# Patient Record
Sex: Male | Born: 1980 | Race: Black or African American | Hispanic: No | Marital: Single | Smoking: Never smoker
Health system: Southern US, Community
[De-identification: ages and names within clinical notes are randomized; demographics above are authoritative.]

## PROBLEM LIST (undated history)

## (undated) DIAGNOSIS — I2699 Other pulmonary embolism without acute cor pulmonale: Secondary | ICD-10-CM

## (undated) DIAGNOSIS — I82409 Acute embolism and thrombosis of unspecified deep veins of unspecified lower extremity: Secondary | ICD-10-CM

## (undated) HISTORY — DX: Other pulmonary embolism without acute cor pulmonale: I26.99

## (undated) HISTORY — PX: NO PAST SURGERIES: SHX2092

## (undated) HISTORY — DX: Acute embolism and thrombosis of unspecified deep veins of unspecified lower extremity: I82.409

---

## 2002-01-29 ENCOUNTER — Emergency Department (HOSPITAL_COMMUNITY): Admission: EM | Admit: 2002-01-29 | Discharge: 2002-01-29 | Payer: Self-pay | Admitting: Emergency Medicine

## 2003-07-25 ENCOUNTER — Emergency Department (HOSPITAL_COMMUNITY): Admission: EM | Admit: 2003-07-25 | Discharge: 2003-07-25 | Payer: Self-pay | Admitting: Emergency Medicine

## 2007-11-24 ENCOUNTER — Emergency Department (HOSPITAL_COMMUNITY): Admission: EM | Admit: 2007-11-24 | Discharge: 2007-11-24 | Payer: Self-pay | Admitting: Family Medicine

## 2010-05-16 ENCOUNTER — Emergency Department (HOSPITAL_COMMUNITY)
Admission: EM | Admit: 2010-05-16 | Discharge: 2010-05-16 | Disposition: A | Discharge status: Home / Self Care | Payer: Self-pay | Attending: Emergency Medicine | Admitting: Emergency Medicine

## 2010-05-16 ENCOUNTER — Emergency Department (HOSPITAL_COMMUNITY): Payer: Self-pay

## 2010-05-16 DIAGNOSIS — I10 Essential (primary) hypertension: Secondary | ICD-10-CM | POA: Insufficient documentation

## 2010-05-16 DIAGNOSIS — M25519 Pain in unspecified shoulder: Secondary | ICD-10-CM | POA: Insufficient documentation

## 2010-05-16 LAB — POCT I-STAT, CHEM 8
BUN: 6 mg/dL (ref 6–23)
Creatinine, Ser: 1.5 mg/dL (ref 0.4–1.5)
Glucose, Bld: 96 mg/dL (ref 70–99)
Potassium: 4.5 mEq/L (ref 3.5–5.1)
Sodium: 140 mEq/L (ref 135–145)
TCO2: 28 mmol/L (ref 0–100)

## 2010-05-16 LAB — POCT CARDIAC MARKERS
CKMB, poc: <1 ng/mL — ABNORMAL LOW (ref 1.0–8.0)
Myoglobin, poc: 58.3 ng/mL (ref 12–200)

## 2015-08-04 ENCOUNTER — Emergency Department (HOSPITAL_COMMUNITY): Payer: No Typology Code available for payment source

## 2015-08-04 ENCOUNTER — Emergency Department (HOSPITAL_COMMUNITY)
Admission: EM | Admit: 2015-08-04 | Discharge: 2015-08-04 | Disposition: A | Discharge status: Home / Self Care | Payer: No Typology Code available for payment source | Attending: Emergency Medicine | Admitting: Emergency Medicine

## 2015-08-04 ENCOUNTER — Encounter (HOSPITAL_COMMUNITY): Payer: Self-pay | Admitting: Emergency Medicine

## 2015-08-04 DIAGNOSIS — M542 Cervicalgia: Secondary | ICD-10-CM | POA: Diagnosis present

## 2015-08-04 DIAGNOSIS — Y9389 Activity, other specified: Secondary | ICD-10-CM | POA: Insufficient documentation

## 2015-08-04 DIAGNOSIS — Y999 Unspecified external cause status: Secondary | ICD-10-CM | POA: Diagnosis not present

## 2015-08-04 DIAGNOSIS — Y92410 Unspecified street and highway as the place of occurrence of the external cause: Secondary | ICD-10-CM | POA: Insufficient documentation

## 2015-08-04 MED ORDER — IBUPROFEN 800 MG PO TABS: 800.0000 mg | ORAL_TABLET | Freq: Three times a day (TID) | ORAL | 0 refills | Status: DC | PRN

## 2015-08-04 MED ORDER — CYCLOBENZAPRINE HCL 10 MG PO TABS: 10.0000 mg | ORAL_TABLET | Freq: Three times a day (TID) | ORAL | 0 refills | Status: DC | PRN
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
## 2015-08-04 NOTE — Discharge Instructions (Signed)
Read the information below.  Use the prescribed medication as directed.  Please discuss all new medications with your pharmacist.  You may return to the Emergency Department at any time for worsening condition or any new symptoms that concern you.    If you develop uncontrolled pain, loss of control of bowel or bladder, weakness or numbness in your arms or legs, or are unable to walk, return to the ER for a recheck.

## 2017-07-17 ENCOUNTER — Emergency Department (HOSPITAL_COMMUNITY): Payer: BC Managed Care – PPO

## 2017-07-17 ENCOUNTER — Encounter (HOSPITAL_COMMUNITY): Payer: Self-pay | Admitting: *Deleted

## 2017-07-17 ENCOUNTER — Inpatient Hospital Stay (HOSPITAL_COMMUNITY)
Admission: EM | Admit: 2017-07-17 | Discharge: 2017-07-20 | DRG: 299 | Disposition: A | Discharge status: Home / Self Care | Payer: BC Managed Care – PPO | Attending: Internal Medicine | Admitting: Internal Medicine

## 2017-07-17 ENCOUNTER — Other Ambulatory Visit: Payer: Self-pay

## 2017-07-17 DIAGNOSIS — I82422 Acute embolism and thrombosis of left iliac vein: Principal | ICD-10-CM | POA: Diagnosis present

## 2017-07-17 DIAGNOSIS — I2699 Other pulmonary embolism without acute cor pulmonale: Secondary | ICD-10-CM

## 2017-07-17 DIAGNOSIS — Z832 Family history of diseases of the blood and blood-forming organs and certain disorders involving the immune mechanism: Secondary | ICD-10-CM

## 2017-07-17 DIAGNOSIS — I824Y1 Acute embolism and thrombosis of unspecified deep veins of right proximal lower extremity: Secondary | ICD-10-CM

## 2017-07-17 DIAGNOSIS — Z8042 Family history of malignant neoplasm of prostate: Secondary | ICD-10-CM

## 2017-07-17 DIAGNOSIS — Z7982 Long term (current) use of aspirin: Secondary | ICD-10-CM

## 2017-07-17 DIAGNOSIS — Z803 Family history of malignant neoplasm of breast: Secondary | ICD-10-CM

## 2017-07-17 DIAGNOSIS — I82409 Acute embolism and thrombosis of unspecified deep veins of unspecified lower extremity: Secondary | ICD-10-CM

## 2017-07-17 LAB — COMPREHENSIVE METABOLIC PANEL
ALBUMIN: 4.3 g/dL (ref 3.5–5.0)
ALK PHOS: 120 U/L (ref 38–126)
ALT: 22 U/L (ref 0–44)
ANION GAP: 9 (ref 5–15)
AST: 17 U/L (ref 15–41)
BUN: 6 mg/dL (ref 6–20)
CALCIUM: 9.5 mg/dL (ref 8.9–10.3)
CO2: 30 mmol/L (ref 22–32)
Chloride: 98 mmol/L (ref 98–111)
Creatinine, Ser: 1.19 mg/dL (ref 0.61–1.24)
GFR calc Af Amer: >60 mL/min (ref >60)
GFR calc non Af Amer: >60 mL/min (ref >60)
GLUCOSE: 119 mg/dL — AB (ref 70–99)
Potassium: 3.6 mmol/L (ref 3.5–5.1)
SODIUM: 137 mmol/L (ref 135–145)
TOTAL PROTEIN: 8.6 g/dL — AB (ref 6.5–8.1)
Total Bilirubin: 1.1 mg/dL (ref 0.3–1.2)

## 2017-07-17 LAB — LIPASE, BLOOD: Lipase: 31 U/L (ref 11–51)

## 2017-07-17 LAB — CBC WITH DIFFERENTIAL/PLATELET
Basophils Absolute: 0 10*3/uL (ref 0.0–0.1)
Basophils Relative: 0 %
EOS ABS: 0 10*3/uL (ref 0.0–0.7)
Eosinophils Relative: 0 %
HCT: 43 % (ref 39.0–52.0)
HEMOGLOBIN: 15.1 g/dL (ref 13.0–17.0)
LYMPHS ABS: 3 10*3/uL (ref 0.7–4.0)
Lymphocytes Relative: 24 %
MCH: 35.7 pg — AB (ref 26.0–34.0)
MCHC: 35.1 g/dL (ref 30.0–36.0)
MCV: 101.7 fL — ABNORMAL HIGH (ref 78.0–100.0)
MONO ABS: 1 10*3/uL (ref 0.1–1.0)
MONOS PCT: 8 %
NEUTROS PCT: 68 %
Neutro Abs: 8.3 10*3/uL — ABNORMAL HIGH (ref 1.7–7.7)
Platelets: 230 10*3/uL (ref 150–400)
RBC: 4.23 MIL/uL (ref 4.22–5.81)
RDW: 12.6 % (ref 11.5–15.5)
WBC: 12.3 10*3/uL — ABNORMAL HIGH (ref 4.0–10.5)

## 2017-07-17 MED ORDER — SODIUM CHLORIDE 0.9 % IV BOLUS
1000.0000 mL | Freq: Once | INTRAVENOUS | Status: AC
  Administered 2017-07-17: 1000 mL via INTRAVENOUS

## 2017-07-17 MED ORDER — MORPHINE SULFATE (PF) 4 MG/ML IV SOLN
4.0000 mg | Freq: Once | INTRAVENOUS | Status: AC
Start: 2017-07-17 — End: 2017-07-17
  Administered 2017-07-17: 4 mg via INTRAVENOUS
  Filled 2017-07-17: qty 1

## 2017-07-17 MED ORDER — IOPAMIDOL (ISOVUE-300) INJECTION 61%
100.0000 mL | Freq: Once | INTRAVENOUS | Status: AC | PRN
  Administered 2017-07-17: 100 mL via INTRAVENOUS

## 2017-07-17 MED ORDER — ONDANSETRON HCL 4 MG/2ML IJ SOLN
4.0000 mg | Freq: Once | INTRAMUSCULAR | Status: AC
  Administered 2017-07-17: 4 mg via INTRAVENOUS
  Filled 2017-07-17: qty 2

## 2017-07-17 NOTE — ED Triage Notes (Signed)
Pt c/o right shoulder, right chest, right upper abd pain that started a few weeks ago but had went away only to return this am, pain is associated with nausea, admits to respiratory congestion with a cough that is blood tinged,

## 2017-07-17 NOTE — ED Provider Notes (Signed)
[facility]ENCY [facility]PART[facility] Provider Note   CSN: [id number] Arrival date & time: 07/17/17  2143     History   Chief Complaint Chief Complaint  Patient presents with  . Abdominal Pain    HPI Steven Knox is a 37 y.o. male who presents for evaluation of right sided abdominal pain that began today.  Patient reports that over the last 2 weeks, he has had some intermittent right upper quadrant abdominal pain, right shoulder pain.  Patient reports that these pain would be intermittent and would come and go.  He states there is no trigger, preceding incident.  Patient states that it was resolved on its own.  Patient states that he had had some nasal congestion, rhinorrhea, cough.  He states that improved but his cough was still lingering.  He states that cough he has had some mucus with blood-tinged.  No gross hemoptysis.  She reports that today, the pain in his right abdomen returned and was more severe and worse.  Patient reports he did have one episode of nausea/vomiting while at work.  Emesis was nonbloody, nonbilious.  He states that he has not had anything to eat since last night because he has had decreased appetite.  Patient states that he does not recall the abdominal pain BF worsened by food.  He states he has been taking BCs powder with no improvement in symptoms. His last bowel movement was this morning and was normal.  Patient denies any fevers, chest pain, difficulty breathing, urinary complaints.  He denies recent immobilization, prior history of DVT/PE, recent surgery, leg swelling, or long travel. He does report a long commute of approximately an hour a day.    The history is provided by the patient.    History reviewed. No pertinent past medical history.  There are no active problems to display for this patient.   History reviewed. No pertinent surgical history.      Home Medications    Prior to Admission medications   Medication Sig Start Date End Date Taking?  Authorizing Provider  Aspirin-Salicylamide-Caffeine (BC HEADACHE) 325-95-16 MG TABS Take 0.5-1 packets by mouth daily as needed (for pain).   Yes [provider]    Family History No family history on file.  Social History Social History   Tobacco Use  . Smoking status: Never Smoker  . Smokeless tobacco: Never Used  Substance Use Topics  . Alcohol use: Yes    Comment: once a month  . Drug use: No     Allergies   Patient has no known allergies.   Review of Systems Review of Systems  Constitutional: Negative for fever.  HENT: Positive for congestion.   Respiratory: Positive for cough. Negative for shortness of breath.   Cardiovascular: Negative for chest pain.  Gastrointestinal: Positive for abdominal pain, nausea and vomiting.  Genitourinary: Negative for dysuria and hematuria.  Neurological: Negative for headaches.  All other systems reviewed and are negative.    Physical Exam Updated Vital Signs BP (!) 141/109   Pulse 85   Temp 98.5 F (36.9 C) (Oral)   Resp (!) 21   Ht 5\' 6"  (1.676 m)   Wt 95.3 kg (210 lb)   SpO2 97%   BMI 33.89 kg/m   Physical Exam  Constitutional: He is oriented to person, place, and time. He appears well-developed and well-nourished.  Appears uncomfortable  HENT:  Head: Normocephalic and atraumatic.  Mouth/Throat: Oropharynx is clear and moist and mucous membranes are normal.  Eyes: Pupils are  equal, round, and reactive to light. Conjunctivae, EOM and lids are normal.  Neck: Full passive range of motion without pain.  Cardiovascular: Normal rate, regular rhythm, normal heart sounds and normal pulses. Exam reveals no gallop and no friction rub.  No murmur heard. Pulses:      Dorsalis pedis pulses are 2+ on the right side, and 2+ on the left side.  Pulmonary/Chest: Effort normal and breath sounds normal.  Lungs clear to auscultation bilaterally.  Symmetric chest rise.  No wheezing, rales, rhonchi.  Abdominal: Soft. Normal  appearance. There is tenderness in the right upper quadrant and right lower quadrant. There is no rigidity, no guarding and no CVA tenderness.  Musculoskeletal: Normal range of motion.  Neurological: He is alert and oriented to person, place, and time.  Sensation intact along major nerve distributions of BLE  Skin: Skin is warm and dry. Capillary refill takes less than 2 seconds.  Good distal cap refill. BLE are not dusky in appearance or cool to touch.  Psychiatric: He has a normal mood and affect. His speech is normal.  Nursing note and vitals reviewed.    ED Treatments / Results  Labs (all labs ordered are listed, but only abnormal results are displayed) Labs Reviewed  COMPREHENSIVE METABOLIC PANEL - Abnormal; Notable for the following components:      Result Value   Glucose, Bld 119 (*)    Total Protein 8.6 (*)    All other components within normal limits  CBC WITH DIFFERENTIAL/PLATELET - Abnormal; Notable for the following components:   WBC 12.3 (*)    MCV 101.7 (*)    MCH 35.7 (*)    Neutro Abs 8.3 (*)    All other components within normal limits  LIPASE, BLOOD  APTT  PROTIME-INR  URINALYSIS, ROUTINE W REFLEX MICROSCOPIC    EKG None  Radiology Dg Chest 2 View  Result Date: 07/17/2017 CLINICAL DATA:  37 year old male with cough. EXAM: CHEST - 2 VIEW COMPARISON:  Chest radiograph dated 05/16/2010 FINDINGS: Shallow inspiration. No focal consolidation, pleural effusion, or pneumothorax. The cardiac silhouette is within normal limits. No acute osseous pathology. IMPRESSION: No active cardiopulmonary disease. Electronically Signed   By: Elgie Collard M.D.   On: 07/17/2017 23:31   Ct Abdomen Pelvis W Contrast  Result Date: 07/17/2017 CLINICAL DATA:  37 year old male with right upper quadrant abdominal pain. EXAM: CT ABDOMEN AND PELVIS WITH CONTRAST TECHNIQUE: Multidetector CT imaging of the abdomen and pelvis was performed using the standard protocol following bolus  administration of intravenous contrast. CONTRAST:  ISOVUE-300 IOPAMIDOL (ISOVUE-300) INJECTION 61% COMPARISON:  None. FINDINGS: Lower chest: There is a patchy area of airspace density at the right lung base which may represent developing infiltrate but concerning for an area of pulmonary infarct. Somewhat dilated and low attenuating vessel partially visualized (series 2 images 1-3) is concerning for pulmonary embolism. There is no intra-abdominal free air or free fluid. Hepatobiliary: No focal liver abnormality is seen. No gallstones, gallbladder wall thickening, or biliary dilatation. Pancreas: Unremarkable. No pancreatic ductal dilatation or surrounding inflammatory changes. Spleen: Normal in size without focal abnormality. Adrenals/Urinary Tract: Adrenal glands are unremarkable. Kidneys are normal, without renal calculi, focal lesion, or hydronephrosis. Bladder is unremarkable. Stomach/Bowel: Stomach is within normal limits. Appendix appears normal. No evidence of bowel wall thickening, distention, or inflammatory changes. Vascular/Lymphatic: There is a clot in the left internal iliac vein extending into the bifurcation of the left common iliac vein. 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 The IVC appears unremarkable as visualized. No clot noted in the IVC. The abdominal aorta is unremarkable. The SMV, splenic vein, and main portal vein are patent. No portal venous gas. There is no adenopathy. Reproductive: The prostate and seminal vesicles are grossly unremarkable. Partially visualized bilateral hydroceles. Other: None Musculoskeletal: No acute or significant osseous findings. IMPRESSION: 1. 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 2. An area of airspace density at the right lung base concerning  for pulmonary infarct related to right lower lobe embolus. Evaluation of the visualized pulmonary arteries is very limited due to timing of the contrast and suboptimal opacification of these vessels. Further evaluation with V/Q scan or CT angiography of the chest recommended. These results were called by telephone at the time of interpretation on 07/17/2017 at 11:54 pm to Dr. Oletta Cohn, who verbally acknowledged these results. Electronically Signed   By: Elgie Collard M.D.   On: 07/17/2017 23:56    Procedures .Critical Care Performed by: Maxwell Caul, PA-C Authorized by: Maxwell Caul, PA-C   Critical care provider statement:    Critical care time (minutes):  35   Critical care time was exclusive of:  Separately billable procedures and treating other patients   Critical care was necessary to treat or prevent imminent or life-threatening deterioration of the following conditions:  Cardiac failure, renal failure, respiratory failure and circulatory failure   Critical care was time spent personally by me on the following activities:  Blood draw for specimens, ordering and performing treatments and interventions, ordering and review of laboratory studies, development of treatment plan with patient or surrogate, ordering and review of radiographic studies, re-evaluation of patient's condition and evaluation of patient's response to treatment   (including critical care time)  Medications Ordered in ED Medications  heparin ADULT infusion 100 units/mL (25000 units/292mL sodium chloride 0.45%) (1,500 Units/hr Intravenous New Bag/Given 07/18/17 0051)  sodium chloride 0.9 % bolus 1,000 mL (0 mLs Intravenous Stopped 07/18/17 0010)  morphine 4 MG/ML injection 4 mg (4 mg Intravenous Given 07/17/17 2231)  ondansetron (ZOFRAN) injection 4 mg (4 mg Intravenous Given 07/17/17 2231)  iopamidol (ISOVUE-300) 61 % injection 100 mL (100 mLs Intravenous Contrast Given 07/17/17 2338)  heparin injection 4,000 Units  (4,000 Units Intravenous Given 07/18/17 0021)  iopamidol (ISOVUE-370) 76 % injection 100 mL (100 mLs Intravenous Contrast Given 07/18/17 0037)     Initial Impression / Assessment and Plan / ED Course  I have reviewed the triage vital signs and the nursing notes.  Pertinent labs & imaging results that were available during my care of the patient were reviewed by me and considered in my medical decision making (see chart for details).     37 y.o. M who presents for evaluation of right-sided abdominal pain that began today.  Reports her last weeks has had some intermittent shoulder, abdominal pain.  Also reports some nasal condition, cough.  Reports he is having blood-tinged sputum his cough.  No gross hemoptysis.  Came today because the abdominal pain worsened.  Reports decreased appetite secondary to symptoms since last night.  No fevers. One episode of vomiting earlier today. Patient is afebrile, non-toxic appearing, sitting comfortably on examination table. Vital signs reviewed. Patient slightly hypertensive.  On exam, patient is tender to the right upper and right lower quadrant.  Consider appendicitis versus acute  infectious etiology versus hepatobiliary etiology versus kidney stone.  Also consider pneumonia given history of cough.  We will plan to check basic labs, chest x-ray.  Analgesics and antiemetics given in the ED.  CBC with leukocytosis of 12.3.  Otherwise unremarkable.  Lipase is unremarkable.  CMP shows slight hyperglycemia at 119.  Otherwise unremarkable.  UA is pending.  Given leukocytosis and lack of elevation in LFTs, will proceed with CT abdomen pelvis for evaluation of acute appendicitis.  CT abd/pelvis reviewed. 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  Additionally, there is a dense airspace density of the right lung base which may represent developing  infiltrate but is also concerning for a PE.  These concerns, will plan to start patient on heparin bolus.  Will likely need admission given the extensiveness of DVT further CTA evaluation to evaluate PE.   Discussed patient with Dr. Sherryll Burger (hospitalist).  Will plan for admission.  Would like me to consult vascular surgery PC if patient would need angiography or stent placement.  Discussed patient with Dr. Myra Gianotti (Vascular).  He states that they will sometimes intervene with DVTs that extended to the common iliac if the patient is hemodynamically unstable or symptomatic.  Given that patient is not complaining of any leg pain, is not symptomatic, recommends admission to any pain at this time.  Recommends treating with heparin.  No indication for transfer at this time.  If PE is more extensive than originally thought or patient starts become symptomatic then would consider transfer but recommends treatment at this facility at this time.  Final Clinical Impressions(s) / ED Diagnoses   Final diagnoses:  Acute deep vein thrombosis (DVT) of proximal vein of right lower extremity [facility])    ED Discharge Orders    None       Rosana Hoes 07/18/17 1736    Eber Hong, MD 07/23/17 2051

## 2017-07-18 ENCOUNTER — Emergency Department (HOSPITAL_COMMUNITY): Payer: BC Managed Care – PPO

## 2017-07-18 ENCOUNTER — Inpatient Hospital Stay (HOSPITAL_COMMUNITY): Payer: BC Managed Care – PPO

## 2017-07-18 DIAGNOSIS — Z808 Family history of malignant neoplasm of other organs or systems: Secondary | ICD-10-CM | POA: Diagnosis not present

## 2017-07-18 DIAGNOSIS — I82422 Acute embolism and thrombosis of left iliac vein: Secondary | ICD-10-CM | POA: Diagnosis present

## 2017-07-18 DIAGNOSIS — Z8042 Family history of malignant neoplasm of prostate: Secondary | ICD-10-CM

## 2017-07-18 DIAGNOSIS — I82409 Acute embolism and thrombosis of unspecified deep veins of unspecified lower extremity: Secondary | ICD-10-CM

## 2017-07-18 DIAGNOSIS — Z7982 Long term (current) use of aspirin: Secondary | ICD-10-CM | POA: Diagnosis not present

## 2017-07-18 DIAGNOSIS — Z809 Family history of malignant neoplasm, unspecified: Secondary | ICD-10-CM

## 2017-07-18 DIAGNOSIS — I824Y1 Acute embolism and thrombosis of unspecified deep veins of right proximal lower extremity: Secondary | ICD-10-CM | POA: Diagnosis present

## 2017-07-18 DIAGNOSIS — Z803 Family history of malignant neoplasm of breast: Secondary | ICD-10-CM

## 2017-07-18 DIAGNOSIS — I2699 Other pulmonary embolism without acute cor pulmonale: Secondary | ICD-10-CM | POA: Diagnosis present

## 2017-07-18 DIAGNOSIS — Z832 Family history of diseases of the blood and blood-forming organs and certain disorders involving the immune mechanism: Secondary | ICD-10-CM | POA: Diagnosis not present

## 2017-07-18 LAB — URINALYSIS, ROUTINE W REFLEX MICROSCOPIC
Bilirubin Urine: NEGATIVE
Glucose, UA: NEGATIVE mg/dL
HGB URINE DIPSTICK: NEGATIVE
Ketones, ur: 5 mg/dL — AB
Leukocytes, UA: NEGATIVE
Nitrite: NEGATIVE
PROTEIN: NEGATIVE mg/dL
Specific Gravity, Urine: >1.046 — ABNORMAL HIGH (ref 1.005–1.030)
pH: 6 (ref 5.0–8.0)

## 2017-07-18 LAB — APTT: APTT: 31 s (ref 24–36)

## 2017-07-18 LAB — ANTITHROMBIN III: ANTITHROMB III FUNC: 103 % (ref 75–120)

## 2017-07-18 LAB — HEPARIN LEVEL (UNFRACTIONATED): Heparin Unfractionated: 0.53 IU/mL (ref 0.30–0.70)

## 2017-07-18 LAB — PROTIME-INR
INR: 1
Prothrombin Time: 13.1 seconds (ref 11.4–15.2)

## 2017-07-18 LAB — MRSA PCR SCREENING: MRSA by PCR: NEGATIVE

## 2017-07-18 MED ORDER — IPRATROPIUM-ALBUTEROL 0.5-2.5 (3) MG/3ML IN SOLN: 3.0000 mL | Freq: Four times a day (QID) | RESPIRATORY_TRACT | Status: DC | PRN

## 2017-07-18 MED ORDER — ACETAMINOPHEN 650 MG RE SUPP: 650.0000 mg | Freq: Four times a day (QID) | RECTAL | Status: DC | PRN

## 2017-07-18 MED ORDER — ONDANSETRON HCL 4 MG/2ML IJ SOLN
4.0000 mg | Freq: Four times a day (QID) | INTRAMUSCULAR | Status: DC | PRN
  Administered 2017-07-18 – 2017-07-20 (×4): 4 mg via INTRAVENOUS
  Filled 2017-07-18 (×4): qty 2

## 2017-07-18 MED ORDER — HYDRALAZINE HCL 20 MG/ML IJ SOLN: 10.0000 mg | INTRAMUSCULAR | Status: DC | PRN

## 2017-07-18 MED ORDER — MORPHINE SULFATE (PF) 2 MG/ML IV SOLN
INTRAVENOUS | Status: AC
  Filled 2017-07-18: qty 1

## 2017-07-18 MED ORDER — MORPHINE SULFATE (PF) 2 MG/ML IV SOLN
2.0000 mg | INTRAVENOUS | Status: DC | PRN
  Administered 2017-07-18 – 2017-07-19 (×6): 2 mg via INTRAVENOUS
  Filled 2017-07-18 (×6): qty 1

## 2017-07-18 MED ORDER — ACETAMINOPHEN 325 MG PO TABS: 650.0000 mg | ORAL_TABLET | Freq: Four times a day (QID) | ORAL | Status: DC | PRN

## 2017-07-18 MED ORDER — SODIUM CHLORIDE 0.9% FLUSH
3.0000 mL | Freq: Two times a day (BID) | INTRAVENOUS | Status: DC
  Administered 2017-07-18 – 2017-07-20 (×6): 3 mL via INTRAVENOUS

## 2017-07-18 MED ORDER — MORPHINE SULFATE (PF) 2 MG/ML IV SOLN
1.0000 mg | INTRAVENOUS | Status: DC | PRN
  Administered 2017-07-18 (×4): 1 mg via INTRAVENOUS
  Filled 2017-07-18 (×3): qty 1

## 2017-07-18 MED ORDER — HEPARIN (PORCINE) IN NACL 100-0.45 UNIT/ML-% IJ SOLN
1500.0000 [IU]/h | INTRAMUSCULAR | Status: DC
  Administered 2017-07-18 (×2): 1500 [IU]/h via INTRAVENOUS
  Filled 2017-07-18 (×2): qty 250

## 2017-07-18 MED ORDER — ONDANSETRON HCL 4 MG PO TABS: 4.0000 mg | ORAL_TABLET | Freq: Four times a day (QID) | ORAL | Status: DC | PRN

## 2017-07-18 MED ORDER — SODIUM CHLORIDE 0.9 % IV SOLN
250.0000 mL | INTRAVENOUS | Status: DC | PRN
Start: 2017-07-18 — End: 2017-07-20

## 2017-07-18 MED ORDER — HEPARIN SODIUM (PORCINE) 5000 UNIT/ML IJ SOLN
4000.0000 [IU] | Freq: Once | INTRAMUSCULAR | Status: AC
  Administered 2017-07-18: 4000 [IU] via INTRAVENOUS
  Filled 2017-07-18: qty 1

## 2017-07-18 MED ORDER — KETOROLAC TROMETHAMINE 15 MG/ML IJ SOLN
15.0000 mg | Freq: Four times a day (QID) | INTRAMUSCULAR | Status: DC | PRN
  Administered 2017-07-18 – 2017-07-19 (×2): 15 mg via INTRAVENOUS
  Filled 2017-07-18 (×2): qty 1

## 2017-07-18 MED ORDER — SODIUM CHLORIDE 0.9% FLUSH
3.0000 mL | INTRAVENOUS | Status: DC | PRN
Start: 2017-07-18 — End: 2017-07-20

## 2017-07-18 MED ORDER — IOPAMIDOL (ISOVUE-370) INJECTION 76%
100.0000 mL | Freq: Once | INTRAVENOUS | Status: AC | PRN
  Administered 2017-07-18: 100 mL via INTRAVENOUS

## 2017-07-18 NOTE — Consult Note (Signed)
[facility] Consultation Oncology  Name: Steven Knox      MRN: [id number]    Location: IC12/IC12-01  Date: 07/18/2017 Time:5:19 PM   REFERRING PHYSICIAN: Dr. Clementeen Graham  REASON FOR CONSULT: Deep vein thrombosis   DIAGNOSIS: Weakly provoked deep vein thrombosis and pulmonary embolism  HISTORY OF PRESENT ILLNESS: Steven Knox is a 37 year old very pleasant African-American male who is seen in consultation today for further work-up and management of unprovoked deep vein thrombosis.  He came to the hospital last night with complaints of right-sided pleuritic chest pain.  He reports he had such an episode approximately 2 weeks ago which subsided.  He reports that he was bedridden for a couple of days 4 weeks ago when he had sciatica of his left leg.  He went to urgent care and received 2 shots which helped with the pain.  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  Denies any previous history of blood clots.  When he came to the ER, CT scan of the abdomen and pelvis was done which showed DVT involving the left internal iliac vein extending to the junction of the left common iliac vein and an area in the right lung base concerning for pulmonary infarct.  This was followed by a CT scan of the chest which showed lobar and segmental acute PE in the lower lobes.  Doppler of the lower extremities did not reveal any DVT.  He is currently receiving heparin.  His right-sided chest pain has improved.  He reports that his sister was diagnosed with lupus anticoagulant and had at least 2 arterial clots and she is on anticoagulation.  PAST MEDICAL HISTORY:   History reviewed. No pertinent past medical history.  None.  ALLERGIES: No Known Allergies    MEDICATIONS: I have reviewed the patient's current medications.  Patient does not take any medications on a regular basis as outpatient.   PAST SURGICAL HISTORY History reviewed. No pertinent surgical history.  FAMILY HISTORY: No family  history on file.  Sister had arterial thrombus from lupus anticoagulant.  Father had "cancer in the lymph nodes".  Mother had breast cancer.  Maternal grandmother also had breast cancer.  Maternal grandfather had cancer which was unknown.  Paternal grandfather had prostate cancer.  SOCIAL HISTORY:  reports that he has never smoked. He has never used smokeless tobacco. He reports that he drinks alcohol. He reports that he does not use drugs.  PERFORMANCE STATUS: The patient's performance status is 0 - Asymptomatic  PHYSICAL EXAM: Most Recent Vital Signs: Blood pressure (!) 133/91, pulse 81, temperature 98.9 F (37.2 C), temperature source Oral, resp. rate 16, height _0  (1.676 m), weight 210 lb (95.3 kg), SpO2 98 %. BP (!) 133/91 (BP Location: Left Arm)   Pulse 81   Temp 98.9 F (37.2 C) (Oral)   Resp 16   Ht _1  (1.676 m)   Wt 210 lb (95.3 kg)   SpO2 98%   BMI 33.89 kg/m  General appearance: alert, cooperative and appears stated age Head: Normocephalic, without obvious abnormality, atraumatic Eyes: conjunctivae/corneas clear. PERRL, EOM's intact. Fundi benign. Neck: no adenopathy, supple, symmetrical, trachea midline and thyroid not enlarged, symmetric, no tenderness/mass/nodules Lungs: clear to auscultation bilaterally Chest wall: no tenderness Heart: regular rate and rhythm, S1, S2 normal, no murmur, click, rub or gallop Abdomen: soft, non-tender; bowel sounds normal; no masses,  no organomegaly Extremities: extremities normal, atraumatic, no cyanosis or edema Skin: Skin color, texture, turgor  normal. No rashes or lesions Lymph nodes: Cervical, supraclavicular, and axillary nodes normal.  LABORATORY DATA:  Results for orders placed or performed during the hospital encounter of 07/17/17 (from the past 48 hour(s))  Comprehensive metabolic panel     Status: Abnormal   Collection Time: 07/17/17 10:34 PM  Result Value Ref Range   Sodium 137 135 - 145 mmol/L   Potassium 3.6 3.5  - 5.1 mmol/L   Chloride 98 98 - 111 mmol/L    Comment: Please note change in reference range.   CO2 30 22 - 32 mmol/L   Glucose, Bld 119 (H) 70 - 99 mg/dL    Comment: Please note change in reference range.   BUN 6 6 - 20 mg/dL    Comment: Please note change in reference range.   Creatinine, Ser 1.19 0.61 - 1.24 mg/dL   Calcium 9.5 8.9 - 10.3 mg/dL   Total Protein 8.6 (H) 6.5 - 8.1 g/dL   Albumin 4.3 3.5 - 5.0 g/dL   AST 17 15 - 41 U/L   ALT 22 0 - 44 U/L    Comment: Please note change in reference range.   Alkaline Phosphatase 120 38 - 126 U/L   Total Bilirubin 1.1 0.3 - 1.2 mg/dL   GFR calc non Af Amer >60 >60 mL/min   GFR calc Af Amer >60 >60 mL/min    Comment: (NOTE) The eGFR has been calculated using the CKD EPI equation. This calculation has not been validated in all clinical situations. eGFR's persistently <60 mL/min signify possible Chronic Kidney Disease.    Anion gap 9 5 - 15    Comment: Performed at [facility], [address], [place], [place] [address]  Lipase, blood     Status: None   Collection Time: 07/17/17 10:34 PM  Result Value Ref Range   Lipase 31 11 - 51 U/L    Comment: Performed at [facility], [address], [place], [place] [address]  CBC with Differential     Status: Abnormal   Collection Time: 07/17/17 10:34 PM  Result Value Ref Range   WBC 12.3 (H) 4.0 - 10.5 K/uL   RBC 4.23 4.22 - 5.81 MIL/uL   Hemoglobin 15.1 13.0 - 17.0 g/dL   HCT 43.0 39.0 - 52.0 %   MCV 101.7 (H) 78.0 - 100.0 fL   MCH 35.7 (H) 26.0 - 34.0 pg   MCHC 35.1 30.0 - 36.0 g/dL   RDW 12.6 11.5 - 15.5 %   Platelets 230 150 - 400 K/uL   Neutrophils Relative % 68 %   Neutro Abs 8.3 (H) 1.7 - 7.7 K/uL   Lymphocytes Relative 24 %   Lymphs Abs 3.0 0.7 - 4.0 K/uL   Monocytes Relative 8 %   Monocytes Absolute 1.0 0.1 - 1.0 K/uL   Eosinophils Relative 0 %   Eosinophils Absolute 0.0 0.0 - 0.7 K/uL   Basophils Relative 0 %   Basophils Absolute 0.0 0.0 - 0.1 K/uL    Comment:  Performed at [facility], [address] [place], [place] [address]  APTT     Status: None   Collection Time: 07/17/17 10:34 PM  Result Value Ref Range   aPTT 31 24 - 36 seconds    Comment: Performed at [facility], [address] [place], [place] [address]  Protime-INR     Status: None   Collection Time: 07/17/17 10:34 PM  Result Value Ref Range   Prothrombin Time 13.1 11.4 - 15.2 seconds  INR 1.00     Comment: Performed at [facility], [address], [place], [place] [address]  Antithrombin III     Status: None   Collection Time: 07/18/17  2:34 AM  Result Value Ref Range   AntiThromb III Func 103 75 - 120 %    Comment: Performed at [facility] [address] [place], [place] [address]  Urinalysis, Routine w reflex microscopic     Status: Abnormal   Collection Time: 07/18/17  2:52 AM  Result Value Ref Range   Color, Urine YELLOW YELLOW   APPearance CLEAR CLEAR   Specific Gravity, Urine >1.046 (H) 1.005 - 1.030   pH 6.0 5.0 - 8.0   Glucose, UA NEGATIVE NEGATIVE mg/dL   Hgb urine dipstick NEGATIVE NEGATIVE   Bilirubin Urine NEGATIVE NEGATIVE   Ketones, ur 5 (A) NEGATIVE mg/dL   Protein, ur NEGATIVE NEGATIVE mg/dL   Nitrite NEGATIVE NEGATIVE   Leukocytes, UA NEGATIVE NEGATIVE    Comment: Performed at [facility], [address], [place], [place] [address]  MRSA PCR Screening     Status: None   Collection Time: 07/18/17  3:44 AM  Result Value Ref Range   MRSA by PCR NEGATIVE NEGATIVE    Comment:        The GeneXpert MRSA Assay (FDA approved for NASAL specimens only), is one component of a comprehensive MRSA colonization surveillance program. It is not intended to diagnose MRSA infection nor to guide or monitor treatment for MRSA infections. Performed at [facility], [address] [place], [place] [address]   Heparin level (unfractionated)     Status: None   Collection Time: 07/18/17  7:44 AM  Result Value Ref Range   Heparin Unfractionated 0.53 0.30 -  0.70 IU/mL    Comment: (NOTE) If heparin results are below expected values, and patient dosage has  been confirmed, suggest follow up testing of antithrombin III levels. Performed at [facility], [address], [place],  [address]       RADIOGRAPHY: Dg Chest 2 View  Result Date: 07/17/2017 CLINICAL DATA:  37 year old male with cough. EXAM: CHEST - 2 VIEW COMPARISON:  Chest radiograph dated 05/16/2010 FINDINGS: Shallow inspiration. No focal consolidation, pleural effusion, or pneumothorax. The cardiac silhouette is within normal limits. No acute osseous pathology. IMPRESSION: No active cardiopulmonary disease. Electronically Signed   By: Anner Crete M.D.   On: 07/17/2017 23:31   Ct Angio Chest Pe W And/or Wo Contrast  Result Date: 07/18/2017 CLINICAL DATA:  37 y/o M; chest pain and nausea. Concern for acute pulmonary embolus. Positive DVT. EXAM: CT ANGIOGRAPHY CHEST WITH CONTRAST TECHNIQUE: Multidetector CT imaging of the chest was performed using the standard protocol during bolus administration of intravenous contrast. Multiplanar CT image reconstructions and MIPs were obtained to evaluate the vascular anatomy. CONTRAST:  173m ISOVUE-370 IOPAMIDOL (ISOVUE-370) INJECTION 76% COMPARISON:  07/17/2017 CT of the abdomen and pelvis. FINDINGS: Cardiovascular: Satisfactory opacification of the pulmonary arteries to the segmental level. Multiple acute pulmonary emboli are present within bilateral lower lobe lobar and segmental pulmonary arteries. RV LV ratio equals 0.84. Mediastinum/Nodes: No enlarged mediastinal, hilar, or axillary lymph nodes. Thyroid gland, trachea, and esophagus demonstrate no significant findings. Lungs/Pleura: Peripheral ground-glass opacities in the lower lobes with areas of central sparing, likely pulmonary infarcts. No pleural effusion or pneumothorax. Upper Abdomen: No acute abnormality. Musculoskeletal: No chest wall abnormality. No acute or significant osseous  findings. Review of the MIP images confirms the above findings. IMPRESSION: Positive  for lobar and segmental acute PE in the lower lobes. (RV/LV Ratio = 0.84). 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 Critical Value/emergent results were called by telephone at the time of interpretation on 07/18/2017 at 1:31 am to Dr. Betsey Holiday, who verbally acknowledged these results. Electronically Signed   By: Kristine Garbe M.D.   On: 07/18/2017 01:34   Ct Abdomen Pelvis W Contrast  Result Date: 07/17/2017 CLINICAL DATA:  37 year old male with right upper quadrant abdominal pain. EXAM: CT ABDOMEN AND PELVIS WITH CONTRAST TECHNIQUE: Multidetector CT imaging of the abdomen and pelvis was performed using the standard protocol following bolus administration of intravenous contrast. CONTRAST:  159m ISOVUE-300 IOPAMIDOL (ISOVUE-300) INJECTION 61% COMPARISON:  None. FINDINGS: Lower chest: There is a patchy area of airspace density at the right lung base which may represent developing infiltrate but concerning for an area of pulmonary infarct. Somewhat dilated and low attenuating vessel partially visualized (series 2 images 1-3) is concerning for pulmonary embolism. There is no intra-abdominal free air or free fluid. Hepatobiliary: No focal liver abnormality is seen. No gallstones, gallbladder wall thickening, or biliary dilatation. Pancreas: Unremarkable. No pancreatic ductal dilatation or surrounding inflammatory changes. Spleen: Normal in size without focal abnormality. Adrenals/Urinary Tract: Adrenal glands are unremarkable. Kidneys are normal, without renal calculi, focal lesion, or hydronephrosis. Bladder is unremarkable. Stomach/Bowel: Stomach is within normal limits. Appendix appears normal. No evidence of bowel wall thickening, distention, or inflammatory changes. Vascular/Lymphatic: There is a clot in the left internal iliac vein extending into the bifurcation of the left common iliac vein. 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 The IVC appears unremarkable as visualized. No clot noted in the IVC. The abdominal aorta is unremarkable. The SMV, splenic vein, and main portal vein are patent. No portal venous gas. There is no adenopathy. Reproductive: The prostate and seminal vesicles are grossly unremarkable. Partially visualized bilateral hydroceles. Other: None Musculoskeletal: No acute or significant osseous findings. IMPRESSION: 1. 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 2. An area of airspace density at the right lung base concerning for pulmonary infarct related to right lower lobe embolus. Evaluation of the visualized pulmonary arteries is very limited due to timing of the contrast and suboptimal opacification of these vessels. Further evaluation with V/Q scan or CT angiography of the chest recommended. These results were called by telephone at the time of interpretation on 07/17/2017 at 11:54 pm to Dr. PWaverly Ferrari who verbally acknowledged these results. Electronically Signed   By: AAnner CreteM.D.   On: 07/17/2017 23:56   UK[place]Venous Img Lower Bilateral  Result Date: 07/18/2017 CLINICAL DATA:  37year old male with a history of PE. EXAM: BILATERAL LOWER EXTREMITY VENOUS DOPPLER ULTRASOUND TECHNIQUE: Gray-scale sonography with graded compression, as well as color Doppler and duplex ultrasound were performed to evaluate the lower extremity deep venous systems from the level of the common femoral vein and including the common femoral, femoral, profunda femoral, popliteal and calf veins including the posterior tibial, peroneal and gastrocnemius veins when visible. The superficial great saphenous vein was also interrogated. Spectral Doppler was utilized to evaluate flow at rest and with distal augmentation  maneuvers in the common femoral, femoral and popliteal veins. COMPARISON:  None. FINDINGS: RIGHT LOWER EXTREMITY Common Femoral Vein: No evidence of thrombus. Normal compressibility, respiratory phasicity and response to augmentation. Saphenofemoral  Junction: No evidence of thrombus. Normal compressibility and flow on color Doppler imaging. Profunda Femoral Vein: No evidence of thrombus. Normal compressibility and flow on color Doppler imaging. Femoral Vein: No evidence of thrombus. Normal compressibility, respiratory phasicity and response to augmentation. Popliteal Vein: No evidence of thrombus. Normal compressibility, respiratory phasicity and response to augmentation. Calf Veins: No evidence of thrombus. Normal compressibility and flow on color Doppler imaging. Superficial Great Saphenous Vein: No evidence of thrombus. Normal compressibility and flow on color Doppler imaging. Other Findings:  None. LEFT LOWER EXTREMITY Common Femoral Vein: No evidence of thrombus. Normal compressibility, respiratory phasicity and response to augmentation. Saphenofemoral Junction: No evidence of thrombus. Normal compressibility and flow on color Doppler imaging. Profunda Femoral Vein: No evidence of thrombus. Normal compressibility and flow on color Doppler imaging. Femoral Vein: No evidence of thrombus. Normal compressibility, respiratory phasicity and response to augmentation. Popliteal Vein: No evidence of thrombus. Normal compressibility, respiratory phasicity and response to augmentation. Calf Veins: No evidence of thrombus. Normal compressibility and flow on color Doppler imaging. Superficial Great Saphenous Vein: No evidence of thrombus. Normal compressibility and flow on color Doppler imaging. Other Findings:  None. IMPRESSION: Sonographic survey of the bilateral lower extremities negative for DVT Electronically Signed   By: Corrie Mckusick D.O.   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 2.  Pleuritic chest pain caused by pulmonary infarcts due to bilateral PE  PLAN:  1.Left internal iliac vein DVT and PE: - Patient reports being bedridden for couple of days 4 weeks ago due to sciatica of the left lower extremity, which relieved after receiving shots at an urgent care.  2 weeks ago he started having right-sided pleuritic chest pain one episode which subsided.  He got the same pain again last night and was evaluated in the ER. 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  He was reportedly evaluated by vascular surgery, who did not think thrombolysis/stent is necessary. -His pain is improving after he was started on heparin.  Would recommend transitioning to one of the direct oral anticoagulants. - His sister apparently was diagnosed with lupus anticoagulant, had at least 2 episodes of arterial thrombus.  I would recommend checking lupus anticoagulant, anti-beta-2 glycoprotein 1 antibody, anticardiolipin antibody with tomorrow's labs.  Duration of anticoagulation will be affected by the results of this test. - If he is discharged over the weekend, please have him follow-up with me in my office in 10 to 14 days. -I am off on Friday.  If you need to contact me please call my cell phone.    All questions were answered. The patient knows to call the clinic with any problems, questions or concerns. We can certainly see the patient much sooner if necessary.    Derek Jack

## 2017-07-18 NOTE — Progress Notes (Signed)
PROGRESS NOTE                                                                                                                                                                                                             Patient Demographics:    Steven Knox, is a 37 y.o. male, DOB - March 07, 1980, ZOX:[id number]  Admit date - 07/17/2017   Admitting Physician Pratik Hoover Brunette, DO  Outpatient Primary MD for the patient is Patient, No Pcp Per  LOS - 0  Outpatient Specialists: None  Chief Complaint  Patient presents with  . Abdominal Pain       Brief Narrative 37 year old healthy male with family history of lupus (sister diagnosed in her 30s and on anticoagulation) presented with  right-sided abdominal pain for the past 2 weeks radiating to the right shoulder.  He also had cough with some blood-tinged mucus but no gross hemoptysis.  He had an episode of nonbloody emesis in the ED.  Denies any prior history of DVT/PE, recent surgery, travel or immobilization. 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  A CT angiogram of the chest was also found to have small bilateral lower lobe PE. Patient admitted to ICU for further management and started on IV heparin.   Subjective:   Patient complains of persistent pain in his right mid abdomen.  Denies any shortness of breath   Assessment  & Plan :   Acute left iliac DVT and bilateral lower lobe pulmonary embolism Started on IV heparin.  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  Has family history of lupus.  Doppler lower extremity negative for DVT.  Hemodynamically stable.  ED physician discussed with vascular surgeon Dr. Myra Gianotti on the phone and recommended patient can be monitored on anticoagulation here at [facility] and transfer only if  hemodynamically unstable. Hypercoagulable panel ordered on admission.  Right-sided abdominal pain appears to be radiation from his PE. Hematology consult placed.  Pain controlled with PRN Toradol and IV morphine.   Code Status : Full code  Family Communication  : Wife at bedside  Disposition Plan  : Pending clinical improvement and hematology consult for further evaluation and long-term anticoagulants   Barriers  For Discharge : Active symptoms  Consults  : Hematology.  [facility] at [facility] consulted by ED physician  Procedures  : CT angiogram of the chest, CT abdomen and pelvis, lower externally ultrasound  DVT Prophylaxis  : IV heparin  Lab Results  Component Value Date   PLT 230 07/17/2017    Antibiotics  :    Anti-infectives (From admission, onward)   None        Objective:   Vitals:   07/18/17 0215 07/18/17 0245 07/18/17 0300 07/18/17 0400  BP:  (!) 146/102 139/84 (!) 147/106  Pulse: 79 70 70 81  Resp: (!) 25 20 20 14   Temp:   98.9 F (37.2 C) 98.9 F (37.2 C)  TempSrc:   Oral Oral  SpO2: 99% 98% 98%   Weight:      Height:        Wt Readings from Last 3 Encounters:  07/17/17 95.3 kg (210 lb)     Intake/Output Summary (Last 24 hours) at 07/18/2017 1310 Last data filed at 07/18/2017 0400 Gross per 24 hour  Intake 1047.25 ml  Output 310 ml  Net 737.25 ml     Physical Exam  Gen: In some distress with pain HEENT: no pallor, moist mucosa, supple neck Chest: clear b/l, no added sounds CVS: N S1&S2, no murmurs, rubs or gallop GI: soft, nondistended, right upper abdominal pain, bowel sounds present Musculoskeletal: warm, no edema, no leg swellings     Data Review:    CBC Recent Labs  Lab 07/17/17 2234  WBC 12.3*  HGB 15.1  HCT 43.0  PLT 230  MCV 101.7*  MCH 35.7*  MCHC 35.1  RDW 12.6  LYMPHSABS 3.0  MONOABS 1.0  EOSABS 0.0  BASOSABS 0.0    Chemistries  Recent Labs  Lab 07/17/17 2234  NA 137  K 3.6  CL 98  CO2 30    GLUCOSE 119*  BUN 6  CREATININE 1.19  CALCIUM 9.5  AST 17  ALT 22  ALKPHOS 120  BILITOT 1.1   ------------------------------------------------------------------------------------------------------------------ No results for input(s): CHOL, HDL, LDLCALC, TRIG, CHOLHDL, LDLDIRECT in the last 72 hours.  No results found for: HGBA1C ------------------------------------------------------------------------------------------------------------------ No results for input(s): TSH, T4TOTAL, T3FREE, THYROIDAB in the last 72 hours.  Invalid input(s): FREET3 ------------------------------------------------------------------------------------------------------------------ No results for input(s): VITAMINB12, FOLATE, FERRITIN, TIBC, IRON, RETICCTPCT in the last 72 hours.  Coagulation profile Recent Labs  Lab 07/17/17 2234  INR 1.00    No results for input(s): DDIMER in the last 72 hours.  Cardiac Enzymes No results for input(s): CKMB, TROPONINI, MYOGLOBIN in the last 168 hours.  Invalid input(s): CK ------------------------------------------------------------------------------------------------------------------ No results found for: BNP  Inpatient Medications  Scheduled Meds: . sodium chloride flush  3 mL Intravenous Q12H   Continuous Infusions: . sodium chloride    . heparin 1,500 Units/hr (07/18/17 0400)   PRN Meds:.sodium chloride, acetaminophen **OR** acetaminophen, hydrALAZINE, ipratropium-albuterol, ketorolac, morphine injection, ondansetron **OR** ondansetron (ZOFRAN) IV, sodium chloride flush  Micro Results Recent Results (from the past 240 hour(s))  MRSA PCR Screening     Status: None   Collection Time: 07/18/17  3:44 AM  Result Value Ref Range Status   MRSA by PCR NEGATIVE NEGATIVE Final    Comment:        The GeneXpert MRSA Assay (FDA approved for NASAL specimens only), is one component of a comprehensive MRSA colonization surveillance program. It is  not intended to diagnose MRSA infection nor to guide or monitor treatment for MRSA infections.  Performed at [facility], [address] [place], [place] [address]     Radiology Reports Dg Chest 2 View  Result Date: 07/17/2017 CLINICAL DATA:  37 year old male with cough. EXAM: CHEST - 2 VIEW COMPARISON:  Chest radiograph dated 05/16/2010 FINDINGS: Shallow inspiration. No focal consolidation, pleural effusion, or pneumothorax. The cardiac silhouette is within normal limits. No acute osseous pathology. IMPRESSION: No active cardiopulmonary disease. Electronically Signed   By: Elgie Collard M.D.   On: 07/17/2017 23:31   Ct Angio Chest Pe W And/or Wo Contrast  Result Date: 07/18/2017 CLINICAL DATA:  36 y/o M; chest pain and nausea. Concern for acute pulmonary embolus. Positive DVT. EXAM: CT ANGIOGRAPHY CHEST WITH CONTRAST TECHNIQUE: Multidetector CT imaging of the chest was performed using the standard protocol during bolus administration of intravenous contrast. Multiplanar CT image reconstructions and MIPs were obtained to evaluate the vascular anatomy. CONTRAST:  ISOVUE-370 IOPAMIDOL (ISOVUE-370) INJECTION 76% COMPARISON:  07/17/2017 CT of the abdomen and pelvis. FINDINGS: Cardiovascular: Satisfactory opacification of the pulmonary arteries to the segmental level. Multiple acute pulmonary emboli are present within bilateral lower lobe lobar and segmental pulmonary arteries. RV LV ratio equals 0.84. Mediastinum/Nodes: No enlarged mediastinal, hilar, or axillary lymph nodes. Thyroid gland, trachea, and esophagus demonstrate no significant findings. Lungs/Pleura: Peripheral ground-glass opacities in the lower lobes with areas of central sparing, likely pulmonary infarcts. No pleural effusion or pneumothorax. Upper Abdomen: No acute abnormality. Musculoskeletal: No chest wall abnormality. No acute or significant osseous findings. Review of the MIP images confirms the above findings. IMPRESSION:  Positive for lobar and segmental acute PE in the lower lobes. (RV/LV Ratio = 0.84). 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 Critical Value/emergent results were called by telephone at the time of interpretation on 07/18/2017 at 1:31 am to Dr. Blinda Leatherwood, who verbally acknowledged these results. Electronically Signed   By: Mitzi Hansen M.D.   On: 07/18/2017 01:34   Ct Abdomen Pelvis W Contrast  Result Date: 07/17/2017 CLINICAL DATA:  37 year old male with right upper quadrant abdominal pain. EXAM: CT ABDOMEN AND PELVIS WITH CONTRAST TECHNIQUE: Multidetector CT imaging of the abdomen and pelvis was performed using the standard protocol following bolus administration of intravenous contrast. CONTRAST:  ISOVUE-300 IOPAMIDOL (ISOVUE-300) INJECTION 61% COMPARISON:  None. FINDINGS: Lower chest: There is a patchy area of airspace density at the right lung base which may represent developing infiltrate but concerning for an area of pulmonary infarct. Somewhat dilated and low attenuating vessel partially visualized (series 2 images 1-3) is concerning for pulmonary embolism. There is no intra-abdominal free air or free fluid. Hepatobiliary: No focal liver abnormality is seen. No gallstones, gallbladder wall thickening, or biliary dilatation. Pancreas: Unremarkable. No pancreatic ductal dilatation or surrounding inflammatory changes. Spleen: Normal in size without focal abnormality. Adrenals/Urinary Tract: Adrenal glands are unremarkable. Kidneys are normal, without renal calculi, focal lesion, or hydronephrosis. Bladder is unremarkable. Stomach/Bowel: Stomach is within normal limits. Appendix appears normal. No evidence of bowel wall thickening, distention, or inflammatory changes. Vascular/Lymphatic: There is a clot in the left internal iliac vein extending into the bifurcation of the left common iliac vein. 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 The IVC appears unremarkable as visualized. No clot noted in the IVC. The abdominal aorta is unremarkable. The SMV, splenic vein, and main portal vein are patent. No portal venous gas. There is no adenopathy. Reproductive: The prostate and seminal vesicles are  grossly unremarkable. Partially visualized bilateral hydroceles. Other: None Musculoskeletal: No acute or significant osseous findings. IMPRESSION: 1. 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 2. An area of airspace density at the right lung base concerning for pulmonary infarct related to right lower lobe embolus. Evaluation of the visualized pulmonary arteries is very limited due to timing of the contrast and suboptimal opacification of these vessels. Further evaluation with V/Q scan or CT angiography of the chest recommended. These results were called by telephone at the time of interpretation on 07/17/2017 at 11:54 pm to Dr. Oletta Cohn, who verbally acknowledged these results. Electronically Signed   By: Elgie Collard M.D.   On: 07/17/2017 23:56   US Venous Img Lower Bilateral  Result Date: 07/18/2017 CLINICAL DATA:  37 year old male with a history of PE. EXAM: BILATERAL LOWER EXTREMITY VENOUS DOPPLER ULTRASOUND TECHNIQUE: Gray-scale sonography with graded compression, as well as color Doppler and duplex ultrasound were performed to evaluate the lower extremity deep venous systems from the level of the common femoral vein and including the common femoral, femoral, profunda femoral, popliteal and calf veins including the posterior tibial, peroneal and gastrocnemius veins when visible. The superficial great saphenous vein was also interrogated. Spectral Doppler was utilized to evaluate flow at rest and with distal augmentation maneuvers in the common femoral, femoral and popliteal veins. COMPARISON:   None. FINDINGS: RIGHT LOWER EXTREMITY Common Femoral Vein: No evidence of thrombus. Normal compressibility, respiratory phasicity and response to augmentation. Saphenofemoral Junction: No evidence of thrombus. Normal compressibility and flow on color Doppler imaging. Profunda Femoral Vein: No evidence of thrombus. Normal compressibility and flow on color Doppler imaging. Femoral Vein: No evidence of thrombus. Normal compressibility, respiratory phasicity and response to augmentation. Popliteal Vein: No evidence of thrombus. Normal compressibility, respiratory phasicity and response to augmentation. Calf Veins: No evidence of thrombus. Normal compressibility and flow on color Doppler imaging. Superficial Great Saphenous Vein: No evidence of thrombus. Normal compressibility and flow on color Doppler imaging. Other Findings:  None. LEFT LOWER EXTREMITY Common Femoral Vein: No evidence of thrombus. Normal compressibility, respiratory phasicity and response to augmentation. Saphenofemoral Junction: No evidence of thrombus. Normal compressibility and flow on color Doppler imaging. Profunda Femoral Vein: No evidence of thrombus. Normal compressibility and flow on color Doppler imaging. Femoral Vein: No evidence of thrombus. Normal compressibility, respiratory phasicity and response to augmentation. Popliteal Vein: No evidence of thrombus. Normal compressibility, respiratory phasicity and response to augmentation. Calf Veins: No evidence of thrombus. Normal compressibility and flow on color Doppler imaging. Superficial Great Saphenous Vein: No evidence of thrombus. Normal compressibility and flow on color Doppler imaging. Other Findings:  None. IMPRESSION: Sonographic survey of the bilateral lower extremities negative for DVT Electronically Signed   By: Gilmer Mor D.O.   On: 07/18/2017 10:23    Time Spent in minutes 20   Fabienne Nolasco M.D on 07/18/2017 at 1:10 PM  Between 7am to 7pm - Pager -  [phone]  After 7pm go to [email or web address] - password [facility]  [facility] -  Office  [phone]

## 2017-07-18 NOTE — Progress Notes (Signed)
ANTICOAGULATION CONSULT NOTE - Follow Up Consult  Pharmacy Consult for heparin Indication: pulmonary embolus  No Known Allergies  Patient Measurements: Height: 5\' 6"  (167.6 cm) Weight: 210 lb (95.3 kg) IBW/kg (Calculated) : 63.8  Heparin Dosing Weight: 84 kg  Vital Signs: Temp: 98.9 F (37.2 C) (07/18 0400) Temp Source: Oral (07/18 0400) BP: 147/106 (07/18 0400) Pulse Rate: 81 (07/18 0400)  Labs: Recent Labs    07/17/17 2234 07/18/17 0744  HGB 15.1  --   HCT 43.0  --   PLT 230  --   APTT 31  --   LABPROT 13.1  --   INR 1.00  --   HEPARINUNFRC  --  0.53  CREATININE 1.19  --     Estimated Creatinine Clearance: 94.5 mL/min (by C-G formula based on SCr of 1.19 mg/dL).   Medications:  Medications Prior to Admission  Medication Sig Dispense Refill Last Dose  . Aspirin-Salicylamide-Caffeine (BC HEADACHE) 325-95-16 MG TABS Take 0.5-1 packets by mouth daily as needed (for pain).   07/17/2017 at Unknown time    Assessment: 37 yo male came to ED with right sided pain. Has had intermittent shoulder pain for several weeks. Patient has left internal iliac DVT with bilateral PE.  Heparin level therapeutic at 0.53   Goal of Therapy:  Heparin level 0.3-0.7 units/ml Monitor platelets by anticoagulation protocol: Yes   Plan:  Continue heparin infusion at 1500 units/hr Check anti-Xa level daily while on heparin Continue to monitor H&H and platelets.   Judeth CornfieldSteven Mayrin Schmuck, PharmD Clinical Pharmacist 07/18/2017 8:38 AM

## 2017-07-18 NOTE — ED Notes (Signed)
Patient resting comfortably at this time.

## 2017-07-18 NOTE — ED Notes (Addendum)
Verbal order given for 1 mg of morphine by Dr. Sherryll BurgerShah via phone. Given at 02:47

## 2017-07-18 NOTE — Progress Notes (Signed)
ANTICOAGULATION CONSULT NOTE - Preliminary  Pharmacy Consult for heparin Indication: pulmonary embolus  No Known Allergies  Patient Measurements: Height: 5\' 6"  (167.6 cm) Weight: 210 lb (95.3 kg) IBW/kg (Calculated) : 63.8 HEPARIN DW (KG): 84.4   Vital Signs: Temp: 98.5 F (36.9 C) (07/17 2147) Temp Source: Oral (07/17 2147) BP: 144/91 (07/17 2330) Pulse Rate: 85 (07/17 2330)  Labs: Recent Labs    07/17/17 2234  HGB 15.1  HCT 43.0  PLT 230  CREATININE 1.19   Estimated Creatinine Clearance: 94.5 mL/min (by C-G formula based on SCr of 1.19 mg/dL).  Medical History: History reviewed. No pertinent past medical history.  Medications:  Infusions:  . heparin     PRN:  Anti-infectives (From admission, onward)   None      Assessment: 37 yo male came to ED with right sided pain. Has had intermittent shoulder pain for several weeks.  CT showed DVT in left internal iliac vein and possible PE.  Starting heparin. Labs pending, on no anticoagulation medications at home.    Goal of Therapy:  Heparin level 0.3-0.7 units/ml   Plan:  4000 units heparin already given in ED Start heparin infusion at 1500 units/hr Check anti-Xa level in 6 hours and daily while on heparin Continue to monitor H&H and platelets Preliminary review of pertinent patient information completed.  Jeani HawkingAnnie Penn clinical pharmacist will complete review during morning rounds to assess the patient and finalize treatment regimen.  Jaydan Meidinger Scarlett, RPH 07/18/2017,12:35 AM

## 2017-07-18 NOTE — H&P (Addendum)
History and Physical    Steven Knox ZOX:[id number] DOB: 1981-01-01 DOA: 07/17/2017  PCP: Patient, No Pcp Per   Patient coming from: Home  Chief Complaint: Abdominal pain  HPI: Steven Knox is a 37 y.o. male with no significant past medical history who presented to the ED with complaints of right-sided abdominal pain that began earlier yesterday.  The pain has actually been intermittent over the last 2 weeks with radiation to the right shoulder.  He denies any alleviating or aggravating factors and denies any trauma.  He has had a cough as well with blood-tinged mucus, but no gross hemoptysis.  His abdominal pain had become much more intense today prompting the ED visit and this was associated with an episode of emesis that was nonbloody. He denies any fever, chills, dyspnea, chest pain, diarrhea, or urinary issues.  No history of prior DVT/PE, immobilization, recent surgery, or long travel, but the patient does commute approximately 1 hour a day for work. Of note, he states that his sister has had prior venous thromboembolisms and had been diagnosed with lupus anticoagulant and is undergoing further work-up at this time.  He denies any knowledge of clotting disorders and other family members.   ED Course: Vital signs are stable and laboratory data only remarkable for leukocytosis of 12,300.  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  There is some questionable findings of right lower lobe pulmonary embolus, but CTA as needed.  Chest x-ray with no acute findings.  Patient is currently on room air and in no distress.  Asked EDP to discuss with Dr. Myra Gianotti of vascular surgery who recommends that patient remain on heparin drip for now with no need for transfer unless symptoms worsen.  Review of Systems: All others reviewed and otherwise  negative.  History reviewed. No pertinent past medical history.  History reviewed. No pertinent surgical history.   reports that he has never smoked. He has never used smokeless tobacco. He reports that he drinks alcohol. He reports that he does not use drugs.  No Known Allergies  No family history on file.  Prior to Admission medications   Medication Sig Start Date End Date Taking? Authorizing Provider  Aspirin-Salicylamide-Caffeine (BC HEADACHE) 325-95-16 MG TABS Take 0.5-1 packets by mouth daily as needed (for pain).   Yes [provider]    Physical Exam: Vitals:   07/17/17 2300 07/17/17 2330 07/18/17 0030 07/18/17 0100  BP: (!) 140/95 (!) 144/91 (!) 139/94 (!) 141/109  Pulse: 64 85 79 85  Resp: 20  (!) 23 (!) 21  Temp:      TempSrc:      SpO2: 97% 94% 98% 97%  Weight:      Height:        Constitutional: NAD, calm, comfortable Vitals:   07/17/17 2300 07/17/17 2330 07/18/17 0030 07/18/17 0100  BP: (!) 140/95 (!) 144/91 (!) 139/94 (!) 141/109  Pulse: 64 85 79 85  Resp: 20  (!) 23 (!) 21  Temp:      TempSrc:      SpO2: 97% 94% 98% 97%  Weight:      Height:       Eyes: lids and conjunctivae normal ENMT: Mucous membranes are moist.  Neck: normal, supple Respiratory: clear to auscultation bilaterally. Normal respiratory effort. No accessory muscle use.  Cardiovascular: Regular rate and rhythm, no murmurs. No extremity  edema. Abdomen: no tenderness, no distention. Bowel sounds positive.  Musculoskeletal:  No joint deformity upper and lower extremities.   Skin: no rashes, lesions, ulcers.  Psychiatric: Normal judgment and insight. Alert and oriented x 3. Normal mood.   Labs on Admission: I have personally reviewed following labs and imaging studies  CBC: Recent Labs  Lab 07/17/17 2234  WBC 12.3*  NEUTROABS 8.3*  HGB 15.1  HCT 43.0  MCV 101.7*  PLT 230   Basic Metabolic Panel: Recent Labs  Lab 07/17/17 2234  NA 137  K 3.6  CL 98  CO2 30   GLUCOSE 119*  BUN 6  CREATININE 1.19  CALCIUM 9.5   GFR: Estimated Creatinine Clearance: 94.5 mL/min (by C-G formula based on SCr of 1.19 mg/dL). Liver Function Tests: Recent Labs  Lab 07/17/17 2234  AST 17  ALT 22  ALKPHOS 120  BILITOT 1.1  PROT 8.6*  ALBUMIN 4.3   Recent Labs  Lab 07/17/17 2234  LIPASE 31   No results for input(s): AMMONIA in the last 168 hours. Coagulation Profile: Recent Labs  Lab 07/17/17 2234  INR 1.00   Cardiac Enzymes: No results for input(s): CKTOTAL, CKMB, CKMBINDEX, TROPONINI in the last 168 hours. BNP (last 3 results) No results for input(s): PROBNP in the last 8760 hours. HbA1C: No results for input(s): HGBA1C in the last 72 hours. CBG: No results for input(s): GLUCAP in the last 168 hours. Lipid Profile: No results for input(s): CHOL, HDL, LDLCALC, TRIG, CHOLHDL, LDLDIRECT in the last 72 hours. Thyroid Function Tests: No results for input(s): TSH, T4TOTAL, FREET4, T3FREE, THYROIDAB in the last 72 hours. Anemia Panel: No results for input(s): VITAMINB12, FOLATE, FERRITIN, TIBC, IRON, RETICCTPCT in the last 72 hours. Urine analysis: No results found for: COLORURINE, APPEARANCEUR, LABSPEC, PHURINE, GLUCOSEU, HGBUR, BILIRUBINUR, KETONESUR, PROTEINUR, UROBILINOGEN, NITRITE, LEUKOCYTESUR  Radiological Exams on Admission: Dg Chest 2 View  Result Date: 07/17/2017 CLINICAL DATA:  37 year old male with cough. EXAM: CHEST - 2 VIEW COMPARISON:  Chest radiograph dated 05/16/2010 FINDINGS: Shallow inspiration. No focal consolidation, pleural effusion, or pneumothorax. The cardiac silhouette is within normal limits. No acute osseous pathology. IMPRESSION: No active cardiopulmonary disease. Electronically Signed   By: Elgie CollardArash  Radparvar M.D.   On: 07/17/2017 23:31   Ct Angio Chest Pe W And/or Wo Contrast  Result Date: 07/18/2017 CLINICAL DATA:  37 y/o M; chest pain and nausea. Concern for acute pulmonary embolus. Positive DVT. EXAM: CT ANGIOGRAPHY  CHEST WITH CONTRAST TECHNIQUE: Multidetector CT imaging of the chest was performed using the standard protocol during bolus administration of intravenous contrast. Multiplanar CT image reconstructions and MIPs were obtained to evaluate the vascular anatomy. CONTRAST:  100mL ISOVUE-370 IOPAMIDOL (ISOVUE-370) INJECTION 76% COMPARISON:  07/17/2017 CT of the abdomen and pelvis. FINDINGS: Cardiovascular: Satisfactory opacification of the pulmonary arteries to the segmental level. Multiple acute pulmonary emboli are present within bilateral lower lobe lobar and segmental pulmonary arteries. RV LV ratio equals 0.84. Mediastinum/Nodes: No enlarged mediastinal, hilar, or axillary lymph nodes. Thyroid gland, trachea, and esophagus demonstrate no significant findings. Lungs/Pleura: Peripheral ground-glass opacities in the lower lobes with areas of central sparing, likely pulmonary infarcts. No pleural effusion or pneumothorax. Upper Abdomen: No acute abnormality. Musculoskeletal: No chest wall abnormality. No acute or significant osseous findings. Review of the MIP images confirms the above findings. IMPRESSION: Positive for lobar and segmental acute PE in the lower lobes. (RV/LV Ratio = 0.84). 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 Critical Value/emergent results were called by telephone at  the time of interpretation on 07/18/2017 at 1:31 am to Dr. Blinda Leatherwood, who verbally acknowledged these results. Electronically Signed   By: Mitzi Hansen M.D.   On: 07/18/2017 01:34   Ct Abdomen Pelvis W Contrast  Result Date: 07/17/2017 CLINICAL DATA:  37 year old male with right upper quadrant abdominal pain. EXAM: CT ABDOMEN AND PELVIS WITH CONTRAST TECHNIQUE: Multidetector CT imaging of the abdomen and pelvis was performed using the standard protocol following bolus administration of intravenous contrast. CONTRAST:  ISOVUE-300 IOPAMIDOL (ISOVUE-300) INJECTION 61% COMPARISON:  None. FINDINGS: Lower chest: There is  a patchy area of airspace density at the right lung base which may represent developing infiltrate but concerning for an area of pulmonary infarct. Somewhat dilated and low attenuating vessel partially visualized (series 2 images 1-3) is concerning for pulmonary embolism. There is no intra-abdominal free air or free fluid. Hepatobiliary: No focal liver abnormality is seen. No gallstones, gallbladder wall thickening, or biliary dilatation. Pancreas: Unremarkable. No pancreatic ductal dilatation or surrounding inflammatory changes. Spleen: Normal in size without focal abnormality. Adrenals/Urinary Tract: Adrenal glands are unremarkable. Kidneys are normal, without renal calculi, focal lesion, or hydronephrosis. Bladder is unremarkable. Stomach/Bowel: Stomach is within normal limits. Appendix appears normal. No evidence of bowel wall thickening, distention, or inflammatory changes. Vascular/Lymphatic: There is a clot in the left internal iliac vein extending into the bifurcation of the left common iliac vein. 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 The IVC appears unremarkable as visualized. No clot noted in the IVC. The abdominal aorta is unremarkable. The SMV, splenic vein, and main portal vein are patent. No portal venous gas. There is no adenopathy. Reproductive: The prostate and seminal vesicles are grossly unremarkable. Partially visualized bilateral hydroceles. Other: None Musculoskeletal: No acute or significant osseous findings. IMPRESSION: 1. 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 2. An area of airspace density at the right lung base concerning for pulmonary infarct related to right lower lobe embolus. Evaluation of the visualized pulmonary arteries is very limited due to timing of the contrast and  suboptimal opacification of these vessels. Further evaluation with V/Q scan or CT angiography of the chest recommended. These results were called by telephone at the time of interpretation on 07/17/2017 at 11:54 pm to Dr. Oletta Cohn, who verbally acknowledged these results. Electronically Signed   By: Elgie Collard M.D.   On: 07/17/2017 23:56    Assessment/Plan Principal Problem:   DVT (deep venous thrombosis) (HCC)    1. Left internal iliac DVT with bilateral PE and associated pulmonary infarcts.  Continue on heparin drip for treatment.  I will order hypercoagulable profile given family history and no other inciting factors that would be responsible for the current DVT.  Consider hematology consultation based on results.  Also consider transfer for vascular surgery evaluation should patient become more symptomatic from the DVT/PE. Currently stable.   DVT prophylaxis: Heparin drip Code Status: Full Family Communication: Spouse and son at bedside Disposition Plan:DVT treatment with heparin Consults called:EDP spoke with Dr. Myra Gianotti (Vascular) no need for transfer Admission status: Inpatient, Med-Surg   Walton Digilio Hoover Brunette DO [facility] Pager [phone]  If 7PM-7AM, please contact night-coverage [email or web address] Password [email or web address]  07/18/2017, 1:55 AM

## 2017-07-18 NOTE — Progress Notes (Signed)
Pt states that he has been nauseous and has thrown up twice today but didn't want to say anything at first. Gave him zofran and will offer saltines and a ginger ale once medicine has had time to take effect. Pt states it only happen when he wakes up and/or gets up to use to the bathroom. Pt is not eating much and has a poor appetite. MD aware  Janssen Zee Shelia MediaK Wilfredo Canterbury, RN

## 2017-07-19 DIAGNOSIS — I82422 Acute embolism and thrombosis of left iliac vein: Secondary | ICD-10-CM | POA: Diagnosis present

## 2017-07-19 LAB — BASIC METABOLIC PANEL
ANION GAP: 7 (ref 5–15)
BUN: 10 mg/dL (ref 6–20)
CHLORIDE: 103 mmol/L (ref 98–111)
CO2: 29 mmol/L (ref 22–32)
CREATININE: 1.16 mg/dL (ref 0.61–1.24)
Calcium: 9.1 mg/dL (ref 8.9–10.3)
GFR calc Af Amer: >60 mL/min (ref >60)
GFR calc non Af Amer: >60 mL/min (ref >60)
Glucose, Bld: 121 mg/dL — ABNORMAL HIGH (ref 70–99)
Potassium: 3.8 mmol/L (ref 3.5–5.1)
SODIUM: 139 mmol/L (ref 135–145)

## 2017-07-19 LAB — CBC
HCT: 39 % (ref 39.0–52.0)
HEMOGLOBIN: 13.7 g/dL (ref 13.0–17.0)
MCH: 35.6 pg — AB (ref 26.0–34.0)
MCHC: 35.1 g/dL (ref 30.0–36.0)
MCV: 101.3 fL — AB (ref 78.0–100.0)
PLATELETS: 204 10*3/uL (ref 150–400)
RBC: 3.85 MIL/uL — AB (ref 4.22–5.81)
RDW: 12.6 % (ref 11.5–15.5)
WBC: 10.3 10*3/uL (ref 4.0–10.5)

## 2017-07-19 LAB — BETA-2-GLYCOPROTEIN I ABS, IGG/M/A
Beta-2 Glyco I IgG: <9 GPI IgG units (ref 0–20)
Beta-2-Glycoprotein I IgA: <9 GPI IgA units (ref 0–25)
Beta-2-Glycoprotein I IgM: <9 GPI IgM units (ref 0–32)

## 2017-07-19 LAB — PROTEIN C, TOTAL: Protein C, Total: 114 % (ref 60–150)

## 2017-07-19 LAB — HEPARIN LEVEL (UNFRACTIONATED): HEPARIN UNFRACTIONATED: 0.43 [IU]/mL (ref 0.30–0.70)

## 2017-07-19 LAB — HOMOCYSTEINE: Homocysteine: 154.5 umol/L — ABNORMAL HIGH (ref 0.0–15.0)

## 2017-07-19 LAB — HIV ANTIBODY (ROUTINE TESTING W REFLEX): HIV SCREEN 4TH GENERATION: NONREACTIVE

## 2017-07-19 MED ORDER — HYDROCODONE-ACETAMINOPHEN 5-325 MG PO TABS
2.0000 | ORAL_TABLET | ORAL | Status: DC | PRN
  Administered 2017-07-19 – 2017-07-20 (×4): 2 via ORAL
  Filled 2017-07-19 (×4): qty 2

## 2017-07-19 MED ORDER — RIVAROXABAN 15 MG PO TABS
15.0000 mg | ORAL_TABLET | Freq: Two times a day (BID) | ORAL | Status: DC
  Administered 2017-07-19 – 2017-07-20 (×3): 15 mg via ORAL
  Filled 2017-07-19 (×3): qty 1

## 2017-07-19 MED ORDER — RIVAROXABAN 20 MG PO TABS: 20.0000 mg | ORAL_TABLET | Freq: Every day | ORAL | Status: DC

## 2017-07-19 NOTE — Progress Notes (Signed)
ANTICOAGULATION CONSULT NOTE - Initial Consult  Pharmacy Consult for Xarelto Indication: pulmonary embolus and DVT  No Known Allergies  Patient Measurements: Height: 5\' 6"  (167.6 cm) Weight: 210 lb (95.3 kg) IBW/kg (Calculated) : 63.8   Vital Signs: Temp: 99.1 F (37.3 C) (07/19 0800) Temp Source: Oral (07/19 0800) BP: 149/77 (07/19 0800) Pulse Rate: 86 (07/19 0800)  Labs: Recent Labs    07/17/17 2234 07/18/17 0744 07/19/17 0433  HGB 15.1  --  13.7  HCT 43.0  --  39.0  PLT 230  --  204  APTT 31  --   --   LABPROT 13.1  --   --   INR 1.00  --   --   HEPARINUNFRC  --  0.53 0.43  CREATININE 1.19  --  1.16    Estimated Creatinine Clearance: 97 mL/min (by C-G formula based on SCr of 1.16 mg/dL).   Medical History: History reviewed. No pertinent past medical history.  Medications:  Medications Prior to Admission  Medication Sig Dispense Refill Last Dose  . Aspirin-Salicylamide-Caffeine (BC HEADACHE) 325-95-16 MG TABS Take 0.5-1 packets by mouth daily as needed (for pain).   07/17/2017 at Unknown time    Assessment: 37 yo male came to ED with right sided pain. Has had intermittent shoulder pain for several weeks. Patient has left internal iliac DVT with bilateral PE.  Transitioning from IV heparin to Xarelto.   Goal of Therapy:   Monitor platelets by anticoagulation protocol: Yes   Plan:  Xarelto 15 mg BID x 21 days followed by Xarelto 20 mg daily Monitor labs and s/s of bleeding   Judeth CornfieldSteven Adamarie Izzo, PharmD Clinical Pharmacist 07/19/2017 11:14 AM

## 2017-07-19 NOTE — Care Management Note (Signed)
Case Management Note  Patient Details  Name: Caren Hazyravis Renninger MRN: [id number] Date of Birth: [birth year]  Subjective/Objective:       DVT/PE.              Action/Plan: Xarelto coupon given.  Patient will be following up with hematology.   Expected Discharge Date:   07/20/2017               Expected Discharge Plan:  Home/Self Care  In-House Referral:     Discharge planning Services  CM Consult, Medication Assistance  Post Acute Care Choice:    Choice offered to:     DME Arranged:    DME Agency:     HH Arranged:    HH Agency:     Status of Service:  Completed, signed off  If discussed at [employer] Length [employer] Stay Meetings, dates discussed:    Additional Comments:  Coby Shrewsberry, Chrystine OilerSharley Diane, RN 07/19/2017, 2:05 PM

## 2017-07-19 NOTE — Plan of Care (Signed)
Patient not exhibiting any signs of anxiety at this time. Will continue to monitor.

## 2017-07-19 NOTE — Progress Notes (Signed)
ANTICOAGULATION CONSULT NOTE - Follow Up Consult  Pharmacy Consult for heparin Indication: pulmonary embolus  No Known Allergies  Patient Measurements: Height: 5\' 6"  (167.6 cm) Weight: 210 lb (95.3 kg) IBW/kg (Calculated) : 63.8  Heparin Dosing Weight: 84 kg  Vital Signs: Temp: 98.9 F (37.2 C) (07/19 0500) Temp Source: Oral (07/19 0500) BP: 142/96 (07/19 0500) Pulse Rate: 69 (07/19 0500)  Labs: Recent Labs    07/17/17 2234 07/18/17 0744 07/19/17 0433  HGB 15.1  --  13.7  HCT 43.0  --  39.0  PLT 230  --  204  APTT 31  --   --   LABPROT 13.1  --   --   INR 1.00  --   --   HEPARINUNFRC  --  0.53 0.43  CREATININE 1.19  --  1.16    Estimated Creatinine Clearance: 97 mL/min (by C-G formula based on SCr of 1.16 mg/dL).   Medications:  Medications Prior to Admission  Medication Sig Dispense Refill Last Dose  . Aspirin-Salicylamide-Caffeine (BC HEADACHE) 325-95-16 MG TABS Take 0.5-1 packets by mouth daily as needed (for pain).   07/17/2017 at Unknown time    Assessment: 37 yo male came to ED with right sided pain. Has had intermittent shoulder pain for several weeks. Patient has left internal iliac DVT with bilateral PE. Heparin level therapeutic at 0.43.  Heparin level therapeutic at 0.53   Goal of Therapy:  Heparin level 0.3-0.7 units/ml Monitor platelets by anticoagulation protocol: Yes   Plan:  Continue heparin infusion at 1500 units/hr Check anti-Xa level daily while on heparin Continue to monitor H&H and platelets.   Judeth CornfieldSteven Chenay Nesmith, PharmD Clinical Pharmacist 07/19/2017 7:59 AM

## 2017-07-19 NOTE — Progress Notes (Signed)
PROGRESS NOTE                                                                                                                                                                                                             Patient Demographics:    Steven Knox, is a 37 y.o. male, DOB - 1980/11/01, VPX:[id number]  Admit date - 07/17/2017   Admitting Physician Pratik Hoover Brunette, DO  Outpatient Primary MD for the patient is Patient, No Pcp Per  LOS - 1  Outpatient Specialists: None  Chief Complaint  Patient presents with  . Abdominal Pain       Brief Narrative 37 year old healthy male with family history of lupus (sister diagnosed with lupus in her 30s and on anticoagulation) presented with  right-sided abdominal pain for the past 2 weeks radiating to the right shoulder.  He also had cough with some blood-tinged mucus but no gross hemoptysis.  He had an episode of nonbloody emesis in the ED.  Denies any prior history of DVT/PE, recent surgery, travel or immobilization. 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  A CT angiogram of the chest was also found to have small bilateral lower lobe PE. Patient admitted to ICU for further management and started on IV heparin.   Subjective:   Still having right upper quadrant pain although slightly better since admission..  Had episode of vomiting yesterday evening.   Assessment  & Plan :   Acute left iliac DVT and bilateral lower lobe pulmonary embolism Started on IV heparin.  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  Has family history of lupus.  Doppler lower extremity negative for DVT.  Hemodynamically stable.  ED physician discussed with vascular surgeon Dr. Myra Gianotti on the phone and recommended patient can be monitored on anticoagulation here at  [facility] and transfer only if hemodynamically unstable. -Hypercoagulable panel including lupus anticoagulant and anticardiolipin antibody and anti-beta-2 glycoprotein 1 antibody have been ordered and can be followed as outpatient. Hematology consult appreciated who recommends starting direct oral anticoagulant when patient stable and patient should follow-up with him as outpatient in about 2 weeks.  Based on the lab result he will determine duration of anticoagulant.  Patient will be started on Xarelto today.   Right-sided abdominal pain appears to be radiation from his PE. Pain controlled with PRN Toradol.  Switched IV morphine to Vicodin    Code Status : Full code  Family Communication  : Mother at bedside  Disposition Plan  : Home possibly in a.m. if pain improved and hemodynamically stable Barriers For Discharge : Active symptoms  Consults  : Hematology ( Dr Ellin Saba).  Vascular surgeon at [facility] consulted by ED physician  Procedures  : CT angiogram of the chest, CT abdomen and pelvis, lower externally ultrasound  DVT Prophylaxis  : IV heparin  Lab Results  Component Value Date   PLT 204 07/19/2017    Antibiotics  :    Anti-infectives (From admission, onward)   None        Objective:   Vitals:   07/18/17 1400 07/18/17 2000 07/19/17 0500 07/19/17 0800  BP: (!) 133/91 (!) 146/113 (!) 142/96 (!) 149/77  Pulse:  79 69 86  Resp: 16 18 18 19   Temp: 98.9 F (37.2 C) 99.8 F (37.7 C) 98.9 F (37.2 C) 99.1 F (37.3 C)  TempSrc: Oral Oral Oral Oral  SpO2: 98% 99% 95% 99%  Weight:      Height:        Wt Readings from Last 3 Encounters:  07/17/17 95.3 kg (210 lb)     Intake/Output Summary (Last 24 hours) at 07/19/2017 1058 Last data filed at 07/19/2017 0400 Gross per 24 hour  Intake 360 ml  Output -  Net 360 ml    Physical exam Middle-aged male appears fatigued, in some distress with pain HEENT: Moist mucosa, supple neck Chest: Clear bilaterally, no added  sound CVs: Normal S1 and S2, no murmurs GI: Soft, nondistended, bowel sounds present, right upper quadrant tenderness Musculoskeletal: Warm, no edema       Data Review:    CBC Recent Labs  Lab 07/17/17 2234 07/19/17 0433  WBC 12.3* 10.3  HGB 15.1 13.7  HCT 43.0 39.0  PLT 230 204  MCV 101.7* 101.3*  MCH 35.7* 35.6*  MCHC 35.1 35.1  RDW 12.6 12.6  LYMPHSABS 3.0  --   MONOABS 1.0  --   EOSABS 0.0  --   BASOSABS 0.0  --     Chemistries  Recent Labs  Lab 07/17/17 2234 07/19/17 0433  NA 137 139  K 3.6 3.8  CL 98 103  CO2 30 29  GLUCOSE 119* 121*  BUN 6 10  CREATININE 1.19 1.16  CALCIUM 9.5 9.1  AST 17  --   ALT 22  --   ALKPHOS 120  --   BILITOT 1.1  --    ------------------------------------------------------------------------------------------------------------------ No results for input(s): CHOL, HDL, LDLCALC, TRIG, CHOLHDL, LDLDIRECT in the last 72 hours.  No results found for: HGBA1C ------------------------------------------------------------------------------------------------------------------ No results for input(s): TSH, T4TOTAL, T3FREE, THYROIDAB in the last 72 hours.  Invalid input(s): FREET3 ------------------------------------------------------------------------------------------------------------------ No results for input(s): VITAMINB12, FOLATE, FERRITIN, TIBC, IRON, RETICCTPCT in the last 72 hours.  Coagulation profile Recent Labs  Lab 07/17/17 2234  INR 1.00    No results for input(s): DDIMER in the last 72 hours.  Cardiac Enzymes No results for input(s): CKMB, TROPONINI, MYOGLOBIN in the last 168 hours.  Invalid input(s): CK ------------------------------------------------------------------------------------------------------------------ No results found for: BNP  Inpatient Medications  Scheduled Meds: . sodium chloride flush  3 mL Intravenous Q12H   Continuous Infusions: . sodium chloride    . heparin 1,500 Units/hr  (07/19/17 0400)  PRN Meds:.sodium chloride, acetaminophen **OR** acetaminophen, hydrALAZINE, HYDROcodone-acetaminophen, ipratropium-albuterol, ketorolac, ondansetron **OR** ondansetron (ZOFRAN) IV, sodium chloride flush  Micro Results Recent Results (from the past 240 hour(s))  MRSA PCR Screening     Status: None   Collection Time: 07/18/17  3:44 AM  Result Value Ref Range Status   MRSA by PCR NEGATIVE NEGATIVE Final    Comment:        The GeneXpert MRSA Assay (FDA approved for NASAL specimens only), is one component of a comprehensive MRSA colonization surveillance program. It is not intended to diagnose MRSA infection nor to guide or monitor treatment for MRSA infections. Performed at [facility], [address] [place], [place] [address]     Radiology Reports Dg Chest 2 View  Result Date: 07/17/2017 CLINICAL DATA:  37 year old male with cough. EXAM: CHEST - 2 VIEW COMPARISON:  Chest radiograph dated 05/16/2010 FINDINGS: Shallow inspiration. No focal consolidation, pleural effusion, or pneumothorax. The cardiac silhouette is within normal limits. No acute osseous pathology. IMPRESSION: No active cardiopulmonary disease. Electronically Signed   By: Elgie CollardArash  Radparvar M.D.   On: 07/17/2017 23:31   Ct Angio Chest Pe W And/or Wo Contrast  Result Date: 07/18/2017 CLINICAL DATA:  37 y/o M; chest pain and nausea. Concern for acute pulmonary embolus. Positive DVT. EXAM: CT ANGIOGRAPHY CHEST WITH CONTRAST TECHNIQUE: Multidetector CT imaging of the chest was performed using the standard protocol during bolus administration of intravenous contrast. Multiplanar CT image reconstructions and MIPs were obtained to evaluate the vascular anatomy. CONTRAST:  100mL ISOVUE-370 IOPAMIDOL (ISOVUE-370) INJECTION 76% COMPARISON:  07/17/2017 CT of the abdomen and pelvis. FINDINGS: Cardiovascular: Satisfactory opacification of the pulmonary arteries to the segmental level. Multiple acute pulmonary emboli are  present within bilateral lower lobe lobar and segmental pulmonary arteries. RV LV ratio equals 0.84. Mediastinum/Nodes: No enlarged mediastinal, hilar, or axillary lymph nodes. Thyroid gland, trachea, and esophagus demonstrate no significant findings. Lungs/Pleura: Peripheral ground-glass opacities in the lower lobes with areas of central sparing, likely pulmonary infarcts. No pleural effusion or pneumothorax. Upper Abdomen: No acute abnormality. Musculoskeletal: No chest wall abnormality. No acute or significant osseous findings. Review of the MIP images confirms the above findings. IMPRESSION: Positive for lobar and segmental acute PE in the lower lobes. (RV/LV Ratio = 0.84). 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 Critical Value/emergent results were called by telephone at the time of interpretation on 07/18/2017 at 1:31 am to Dr. Blinda LeatherwoodPollina, who verbally acknowledged these results. Electronically Signed   By: Mitzi HansenLance  Furusawa-Stratton M.D.   On: 07/18/2017 01:34   Ct Abdomen Pelvis W Contrast  Result Date: 07/17/2017 CLINICAL DATA:  37 year old male with right upper quadrant abdominal pain. EXAM: CT ABDOMEN AND PELVIS WITH CONTRAST TECHNIQUE: Multidetector CT imaging of the abdomen and pelvis was performed using the standard protocol following bolus administration of intravenous contrast. CONTRAST:  100mL ISOVUE-300 IOPAMIDOL (ISOVUE-300) INJECTION 61% COMPARISON:  None. FINDINGS: Lower chest: There is a patchy area of airspace density at the right lung base which may represent developing infiltrate but concerning for an area of pulmonary infarct. Somewhat dilated and low attenuating vessel partially visualized (series 2 images 1-3) is concerning for pulmonary embolism. There is no intra-abdominal free air or free fluid. Hepatobiliary: No focal liver abnormality is seen. No gallstones, gallbladder wall thickening, or biliary dilatation. Pancreas: Unremarkable. No pancreatic ductal dilatation or surrounding  inflammatory changes. Spleen: Normal in size without focal abnormality. Adrenals/Urinary Tract: Adrenal glands are unremarkable. Kidneys are normal, without renal calculi, focal lesion, or  hydronephrosis. Bladder is unremarkable. Stomach/Bowel: Stomach is within normal limits. Appendix appears normal. No evidence of bowel wall thickening, distention, or inflammatory changes. Vascular/Lymphatic: There is a clot in the left internal iliac vein extending into the bifurcation of the left common iliac vein. 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 The IVC appears unremarkable as visualized. No clot noted in the IVC. The abdominal aorta is unremarkable. The SMV, splenic vein, and main portal vein are patent. No portal venous gas. There is no adenopathy. Reproductive: The prostate and seminal vesicles are grossly unremarkable. Partially visualized bilateral hydroceles. Other: None Musculoskeletal: No acute or significant osseous findings. IMPRESSION: 1. 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 2. An area of airspace density at the right lung base concerning for pulmonary infarct related to right lower lobe embolus. Evaluation of the visualized pulmonary arteries is very limited due to timing of the contrast and suboptimal opacification of these vessels. Further evaluation with V/Q scan or CT angiography of the chest recommended. These results were called by telephone at the time of interpretation on 07/17/2017 at 11:54 pm to Dr. Oletta Cohn, who verbally acknowledged these results. Electronically Signed   By: Elgie Collard M.D.   On: 07/17/2017 23:56   US Venous Img Lower Bilateral  Result Date: 07/18/2017 CLINICAL DATA:  37 year old male with a history of PE. EXAM: BILATERAL LOWER EXTREMITY VENOUS DOPPLER  ULTRASOUND TECHNIQUE: Gray-scale sonography with graded compression, as well as color Doppler and duplex ultrasound were performed to evaluate the lower extremity deep venous systems from the level of the common femoral vein and including the common femoral, femoral, profunda femoral, popliteal and calf veins including the posterior tibial, peroneal and gastrocnemius veins when visible. The superficial great saphenous vein was also interrogated. Spectral Doppler was utilized to evaluate flow at rest and with distal augmentation maneuvers in the common femoral, femoral and popliteal veins. COMPARISON:  None. FINDINGS: RIGHT LOWER EXTREMITY Common Femoral Vein: No evidence of thrombus. Normal compressibility, respiratory phasicity and response to augmentation. Saphenofemoral Junction: No evidence of thrombus. Normal compressibility and flow on color Doppler imaging. Profunda Femoral Vein: No evidence of thrombus. Normal compressibility and flow on color Doppler imaging. Femoral Vein: No evidence of thrombus. Normal compressibility, respiratory phasicity and response to augmentation. Popliteal Vein: No evidence of thrombus. Normal compressibility, respiratory phasicity and response to augmentation. Calf Veins: No evidence of thrombus. Normal compressibility and flow on color Doppler imaging. Superficial Great Saphenous Vein: No evidence of thrombus. Normal compressibility and flow on color Doppler imaging. Other Findings:  None. LEFT LOWER EXTREMITY Common Femoral Vein: No evidence of thrombus. Normal compressibility, respiratory phasicity and response to augmentation. Saphenofemoral Junction: No evidence of thrombus. Normal compressibility and flow on color Doppler imaging. Profunda Femoral Vein: No evidence of thrombus. Normal compressibility and flow on color Doppler imaging. Femoral Vein: No evidence of thrombus. Normal compressibility, respiratory phasicity and response to augmentation. Popliteal Vein: No evidence  of thrombus. Normal compressibility, respiratory phasicity and response to augmentation. Calf Veins: No evidence of thrombus. Normal compressibility and flow on color Doppler imaging. Superficial Great Saphenous Vein: No evidence of thrombus. Normal compressibility and flow on color Doppler imaging. Other Findings:  None. IMPRESSION: Sonographic survey of the bilateral lower extremities negative for DVT Electronically Signed   By: Gilmer Mor D.O.   On: 07/18/2017  10:23    Time Spent in minutes 25   Arena Lindahl M.D on 07/19/2017 at 10:58 AM  Between 7am to 7pm - Pager - [phone]  After 7pm go to [email or web address] - password [facility]  [facility] -  Office  [phone]

## 2017-07-20 DIAGNOSIS — I2699 Other pulmonary embolism without acute cor pulmonale: Secondary | ICD-10-CM

## 2017-07-20 DIAGNOSIS — I82422 Acute embolism and thrombosis of left iliac vein: Principal | ICD-10-CM

## 2017-07-20 LAB — PROTEIN S ACTIVITY: Protein S Activity: 110 % (ref 63–140)

## 2017-07-20 LAB — CARDIOLIPIN ANTIBODIES, IGG, IGM, IGA
Anticardiolipin IgA: <9 APL U/mL (ref 0–11)
Anticardiolipin IgG: <9 GPL U/mL (ref 0–14)
Anticardiolipin IgM: <9 MPL U/mL (ref 0–12)

## 2017-07-20 LAB — LUPUS ANTICOAGULANT PANEL
DRVVT: 50.3 s — AB (ref 0.0–47.0)
PTT Lupus Anticoagulant: 42.8 s (ref 0.0–51.9)

## 2017-07-20 LAB — DRVVT MIX: dRVVT Mix: 43.5 s (ref 0.0–47.0)

## 2017-07-20 LAB — PROTEIN S, TOTAL: PROTEIN S AG TOTAL: 91 % (ref 60–150)

## 2017-07-20 LAB — PROTEIN C ACTIVITY: PROTEIN C ACTIVITY: 110 % (ref 73–180)

## 2017-07-20 MED ORDER — HYDROCODONE-ACETAMINOPHEN 5-325 MG PO TABS: 1.0000 | ORAL_TABLET | Freq: Four times a day (QID) | ORAL | 0 refills | Status: DC | PRN

## 2017-07-20 MED ORDER — RIVAROXABAN (XARELTO) VTE STARTER PACK (15 & 20 MG): ORAL_TABLET | ORAL | 0 refills | Status: DC

## 2017-07-20 NOTE — Discharge Summary (Signed)
Physician Discharge Summary  Steven Knox ION:[id number] DOB: Jun 19, 1980 DOA: 07/17/2017  PCP: Patient, No Pcp Per  Admit date: 07/17/2017 Discharge date: 07/20/2017  Admitted From: home Disposition:  home  Recommendations for Outpatient Follow-up:  1. Follow up with PCP in 1-2 weeks 2. Please obtain BMP/CBC in one week 3. Follow up with hematology in the next 10-14 days  Discharge Condition:stable CODE STATUS:full code Diet recommendation: regular diet  Brief/Interim Summary: 37 y/o male with family history of lupus, presented with right-sided abdominal pain radiating to his right shoulder, which is been present for 2 weeks prior to admission.  He had had some cough with blood-tinged sputum.  In the emergency room, he was found to have DVT of the left internal iliac vein extending to the junction of the left common iliac vein.  CT angiogram the chest was also found to have small bilateral lower lobe pulmonary embolus.  Patient was admitted to the hospital and started on intravenous heparin.  His case was discussed by emergency room physician with vascular surgery.  It was not felt that thrombectomy or stent was indicated at this time.  Also seen in consultation by hematology who recommended starting the patient on direct oral anticoagulant therapy.  Hypercoagulable work-up has been sent and is in process.  He will follow-up with hematology in the next 10 to 14 days to determine length of therapy.  He is tolerating on Xarelto and overall abdominal pain has improved.  He is not short of breath on exertion.  He feels ready for discharge home.  Discharge Diagnoses:  Principal Problem:   Iliac vein thrombosis, left (HCC) Active Problems:   DVT (deep venous thrombosis) (HCC)   Pulmonary embolism [facility])    Discharge Instructions  Discharge Instructions    Diet - low sodium heart healthy   Complete by:  As directed    Increase activity slowly   Complete by:  As directed      Allergies as  of 07/20/2017   No Known Allergies     Medication List    STOP taking these medications   BC HEADACHE 325-95-16 MG Tabs Generic drug:  Aspirin-Salicylamide-Caffeine     TAKE these medications   HYDROcodone-acetaminophen 5-325 MG tablet Commonly known as:  NORCO/VICODIN Take 1 tablet by mouth every 6 (six) hours as needed for moderate pain.   Rivaroxaban 15 & 20 MG Tbpk Take as directed on package: Start with one 15mg  tablet by mouth twice a day with food. On Day 22, switch to one 20mg  tablet once a day with food.      Follow-up Information    Doreatha Massed, MD. Schedule an appointment as soon as possible for a visit in 1 week(s).   Specialty:  Hematology Contact information: [address] [place] [address] [phone]          No Known Allergies  Consultations:  Hematology, Dr. Ellin Saba   Procedures/Studies: Dg Chest 2 View  Result Date: 07/17/2017 CLINICAL DATA:  37 year old male with cough. EXAM: CHEST - 2 VIEW COMPARISON:  Chest radiograph dated 05/16/2010 FINDINGS: Shallow inspiration. No focal consolidation, pleural effusion, or pneumothorax. The cardiac silhouette is within normal limits. No acute osseous pathology. IMPRESSION: No active cardiopulmonary disease. Electronically Signed   By: Elgie Collard M.D.   On: 07/17/2017 23:31   Ct Angio Chest Pe W And/or Wo Contrast  Result Date: 07/18/2017 CLINICAL DATA:  37 y/o M; chest pain and nausea. Concern for acute pulmonary embolus. Positive DVT. EXAM: CT  ANGIOGRAPHY CHEST WITH CONTRAST TECHNIQUE: Multidetector CT imaging of the chest was performed using the standard protocol during bolus administration of intravenous contrast. Multiplanar CT image reconstructions and MIPs were obtained to evaluate the vascular anatomy. CONTRAST:  ISOVUE-370 IOPAMIDOL (ISOVUE-370) INJECTION 76% COMPARISON:  07/17/2017 CT of the abdomen and pelvis. FINDINGS: Cardiovascular: Satisfactory opacification of the  pulmonary arteries to the segmental level. Multiple acute pulmonary emboli are present within bilateral lower lobe lobar and segmental pulmonary arteries. RV LV ratio equals 0.84. Mediastinum/Nodes: No enlarged mediastinal, hilar, or axillary lymph nodes. Thyroid gland, trachea, and esophagus demonstrate no significant findings. Lungs/Pleura: Peripheral ground-glass opacities in the lower lobes with areas of central sparing, likely pulmonary infarcts. No pleural effusion or pneumothorax. Upper Abdomen: No acute abnormality. Musculoskeletal: No chest wall abnormality. No acute or significant osseous findings. Review of the MIP images confirms the above findings. IMPRESSION: Positive for lobar and segmental acute PE in the lower lobes. (RV/LV Ratio = 0.84). 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 Critical Value/emergent results were called by telephone at the time of interpretation on 07/18/2017 at 1:31 am to Dr. Blinda Leatherwood, who verbally acknowledged these results. Electronically Signed   By: Mitzi Hansen M.D.   On: 07/18/2017 01:34   Ct Abdomen Pelvis W Contrast  Result Date: 07/17/2017 CLINICAL DATA:  37 year old male with right upper quadrant abdominal pain. EXAM: CT ABDOMEN AND PELVIS WITH CONTRAST TECHNIQUE: Multidetector CT imaging of the abdomen and pelvis was performed using the standard protocol following bolus administration of intravenous contrast. CONTRAST:  ISOVUE-300 IOPAMIDOL (ISOVUE-300) INJECTION 61% COMPARISON:  None. FINDINGS: Lower chest: There is a patchy area of airspace density at the right lung base which may represent developing infiltrate but concerning for an area of pulmonary infarct. Somewhat dilated and low attenuating vessel partially visualized (series 2 images 1-3) is concerning for pulmonary embolism. There is no intra-abdominal free air or free fluid. Hepatobiliary: No focal liver abnormality is seen. No gallstones, gallbladder wall thickening, or biliary  dilatation. Pancreas: Unremarkable. No pancreatic ductal dilatation or surrounding inflammatory changes. Spleen: Normal in size without focal abnormality. Adrenals/Urinary Tract: Adrenal glands are unremarkable. Kidneys are normal, without renal calculi, focal lesion, or hydronephrosis. Bladder is unremarkable. Stomach/Bowel: Stomach is within normal limits. Appendix appears normal. No evidence of bowel wall thickening, distention, or inflammatory changes. Vascular/Lymphatic: There is a clot in the left internal iliac vein extending into the bifurcation of the left common iliac vein. 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 The IVC appears unremarkable as visualized. No clot noted in the IVC. The abdominal aorta is unremarkable. The SMV, splenic vein, and main portal vein are patent. No portal venous gas. There is no adenopathy. Reproductive: The prostate and seminal vesicles are grossly unremarkable. Partially visualized bilateral hydroceles. Other: None Musculoskeletal: No acute or significant osseous findings. IMPRESSION: 1. 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 2. An area of airspace density at the right lung base concerning for pulmonary infarct related to right lower lobe embolus. Evaluation of the visualized pulmonary arteries is very limited due to timing of the contrast and suboptimal opacification of these vessels. Further evaluation with V/Q scan or CT angiography of the chest recommended. These results were called by telephone at the time of interpretation on 07/17/2017 at 11:54 pm to Dr. Oletta Cohn, who verbally acknowledged  these results. Electronically Signed   By: Elgie Collard M.D.   On: 07/17/2017 23:56   US Venous Img Lower Bilateral  Result Date: 07/18/2017 CLINICAL DATA:   37 year old male with a history of PE. EXAM: BILATERAL LOWER EXTREMITY VENOUS DOPPLER ULTRASOUND TECHNIQUE: Gray-scale sonography with graded compression, as well as color Doppler and duplex ultrasound were performed to evaluate the lower extremity deep venous systems from the level of the common femoral vein and including the common femoral, femoral, profunda femoral, popliteal and calf veins including the posterior tibial, peroneal and gastrocnemius veins when visible. The superficial great saphenous vein was also interrogated. Spectral Doppler was utilized to evaluate flow at rest and with distal augmentation maneuvers in the common femoral, femoral and popliteal veins. COMPARISON:  None. FINDINGS: RIGHT LOWER EXTREMITY Common Femoral Vein: No evidence of thrombus. Normal compressibility, respiratory phasicity and response to augmentation. Saphenofemoral Junction: No evidence of thrombus. Normal compressibility and flow on color Doppler imaging. Profunda Femoral Vein: No evidence of thrombus. Normal compressibility and flow on color Doppler imaging. Femoral Vein: No evidence of thrombus. Normal compressibility, respiratory phasicity and response to augmentation. Popliteal Vein: No evidence of thrombus. Normal compressibility, respiratory phasicity and response to augmentation. Calf Veins: No evidence of thrombus. Normal compressibility and flow on color Doppler imaging. Superficial Great Saphenous Vein: No evidence of thrombus. Normal compressibility and flow on color Doppler imaging. Other Findings:  None. LEFT LOWER EXTREMITY Common Femoral Vein: No evidence of thrombus. Normal compressibility, respiratory phasicity and response to augmentation. Saphenofemoral Junction: No evidence of thrombus. Normal compressibility and flow on color Doppler imaging. Profunda Femoral Vein: No evidence of thrombus. Normal compressibility and flow on color Doppler imaging. Femoral Vein: No evidence of thrombus. Normal  compressibility, respiratory phasicity and response to augmentation. Popliteal Vein: No evidence of thrombus. Normal compressibility, respiratory phasicity and response to augmentation. Calf Veins: No evidence of thrombus. Normal compressibility and flow on color Doppler imaging. Superficial Great Saphenous Vein: No evidence of thrombus. Normal compressibility and flow on color Doppler imaging. Other Findings:  None. IMPRESSION: Sonographic survey of the bilateral lower extremities negative for DVT Electronically Signed   By: Gilmer Mor D.O.   On: 07/18/2017 10:23       Subjective: Overall abd pain improving. No shortness of breath. Able to ambulate  Discharge Exam: Vitals:   07/19/17 2222 07/20/17 0711  BP: (!) 141/89 133/73  Pulse: 66 (!) 51  Resp: 18 18  Temp:    SpO2: 97% 97%   Vitals:   07/19/17 0800 07/19/17 1729 07/19/17 2222 07/20/17 0711  BP: (!) 149/77 137/80 (!) 141/89 133/73  Pulse: 86 64 66 (!) 51  Resp: 19 16 18 18   Temp: 99.1 F (37.3 C) 98.4 F (36.9 C)    TempSrc: Oral Oral    SpO2: 99% 99% 97% 97%  Weight:      Height:        General: Pt is alert, awake, not in acute distress Cardiovascular: RRR, S1/S2 +, no rubs, no gallops Respiratory: CTA bilaterally, no wheezing, no rhonchi Abdominal: Soft, NT, ND, bowel sounds + Extremities: no edema, no cyanosis    The results of significant diagnostics from this hospitalization (including imaging, microbiology, ancillary and laboratory) are listed below for reference.     Microbiology: Recent Results (from the past 240 hour(s))  MRSA PCR Screening     Status: None   Collection Time: 07/18/17  3:44 AM  Result Value Ref Range Status   MRSA by PCR NEGATIVE  NEGATIVE Final    Comment:        The GeneXpert MRSA Assay (FDA approved for NASAL specimens only), is one component of a comprehensive MRSA colonization surveillance program. It is not intended to diagnose MRSA infection nor to guide or monitor  treatment for MRSA infections. Performed at [facility], [address] [place], [place] [address]      Labs: BNP (last 3 results) No results for input(s): BNP in the last 8760 hours. Basic Metabolic Panel: Recent Labs  Lab 07/17/17 2234 07/19/17 0433  NA 137 139  K 3.6 3.8  CL 98 103  CO2 30 29  GLUCOSE 119* 121*  BUN 6 10  CREATININE 1.19 1.16  CALCIUM 9.5 9.1   Liver Function Tests: Recent Labs  Lab 07/17/17 2234  AST 17  ALT 22  ALKPHOS 120  BILITOT 1.1  PROT 8.6*  ALBUMIN 4.3   Recent Labs  Lab 07/17/17 2234  LIPASE 31   No results for input(s): AMMONIA in the last 168 hours. CBC: Recent Labs  Lab 07/17/17 2234 07/19/17 0433  WBC 12.3* 10.3  NEUTROABS 8.3*  --   HGB 15.1 13.7  HCT 43.0 39.0  MCV 101.7* 101.3*  PLT 230 204   Cardiac Enzymes: No results for input(s): CKTOTAL, CKMB, CKMBINDEX, TROPONINI in the last 168 hours. BNP: Invalid input(s): POCBNP CBG: No results for input(s): GLUCAP in the last 168 hours. D-Dimer No results for input(s): DDIMER in the last 72 hours. Hgb A1c No results for input(s): HGBA1C in the last 72 hours. Lipid Profile No results for input(s): CHOL, HDL, LDLCALC, TRIG, CHOLHDL, LDLDIRECT in the last 72 hours. Thyroid function studies No results for input(s): TSH, T4TOTAL, T3FREE, THYROIDAB in the last 72 hours.  Invalid input(s): FREET3 Anemia work up No results for input(s): VITAMINB12, FOLATE, FERRITIN, TIBC, IRON, RETICCTPCT in the last 72 hours. Urinalysis    Component Value Date/Time   COLORURINE YELLOW 07/18/2017 0252   APPEARANCEUR CLEAR 07/18/2017 0252   LABSPEC >1.046 (H) 07/18/2017 0252   PHURINE 6.0 07/18/2017 0252   GLUCOSEU NEGATIVE 07/18/2017 0252   HGBUR NEGATIVE 07/18/2017 0252   BILIRUBINUR NEGATIVE 07/18/2017 0252   KETONESUR 5 (A) 07/18/2017 0252   PROTEINUR NEGATIVE 07/18/2017 0252   NITRITE NEGATIVE 07/18/2017 0252   LEUKOCYTESUR NEGATIVE 07/18/2017 0252   Sepsis Labs Invalid  input(s): PROCALCITONIN,  WBC,  LACTICIDVEN Microbiology Recent Results (from the past 240 hour(s))  MRSA PCR Screening     Status: None   Collection Time: 07/18/17  3:44 AM  Result Value Ref Range Status   MRSA by PCR NEGATIVE NEGATIVE Final    Comment:        The GeneXpert MRSA Assay (FDA approved for NASAL specimens only), is one component of a comprehensive MRSA colonization surveillance program. It is not intended to diagnose MRSA infection nor to guide or monitor treatment for MRSA infections. Performed at [facility], [address] [place], [place] [address]      Time coordinating discharge: 35mins  SIGNED:   Erick BlinksJehanzeb Memon, MD  [facility] 07/20/2017, 2:23 PM Pager   If 7PM-7AM, please contact night-coverage [email or web address] Password [email or web address]

## 2017-07-20 NOTE — Progress Notes (Signed)
Patient is to be discharged home and in stable condition. Patient's IV removed, WNL. Patient given discharge instructions and verbalized understanding. Patient denies the need for a wheelchair escort out, requesting to ambulate out.  Tahni Porchia P Dishmon, RN  

## 2017-07-22 LAB — FACTOR 5 LEIDEN

## 2017-07-23 LAB — PROTHROMBIN GENE MUTATION

## 2017-07-29 ENCOUNTER — Inpatient Hospital Stay (HOSPITAL_COMMUNITY): Payer: BC Managed Care – PPO | Attending: Hematology | Admitting: Hematology

## 2017-07-29 ENCOUNTER — Other Ambulatory Visit: Payer: Self-pay

## 2017-07-29 ENCOUNTER — Encounter (HOSPITAL_COMMUNITY): Payer: Self-pay | Admitting: Hematology

## 2017-07-29 VITALS — BP 135/95 | HR 68 | Temp 98.7°F | Resp 16 | Wt 196.1 lb

## 2017-07-29 DIAGNOSIS — I82422 Acute embolism and thrombosis of left iliac vein: Secondary | ICD-10-CM

## 2017-07-29 DIAGNOSIS — Z86718 Personal history of other venous thrombosis and embolism: Secondary | ICD-10-CM | POA: Insufficient documentation

## 2017-07-29 DIAGNOSIS — Z7901 Long term (current) use of anticoagulants: Secondary | ICD-10-CM | POA: Diagnosis not present

## 2017-07-29 DIAGNOSIS — Z86711 Personal history of pulmonary embolism: Secondary | ICD-10-CM | POA: Diagnosis not present

## 2017-07-29 DIAGNOSIS — Z87891 Personal history of nicotine dependence: Secondary | ICD-10-CM | POA: Diagnosis not present

## 2017-07-29 DIAGNOSIS — I2699 Other pulmonary embolism without acute cor pulmonale: Secondary | ICD-10-CM

## 2017-07-29 NOTE — Assessment & Plan Note (Addendum)
1.  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 -A CT scan of the chest PE protocol on 07/18/2017 consistent with bilateral PE and bilateral lower lobe infarcts. -He was started on IV heparin which was transitioned to Xarelto.  He is tolerating it very well. - He gives history of left lower extremity sciatica and was completely bedridden for 3 days, 4 weeks prior to presentation to the ER.  Hence it can be considered as a weakly provoked DVT. - Lupus anticoagulant, anti-beta-2 glycoprotein 1 and anticardiolipin antibodies were negative.  Protein C, protein S, Antithrombin III, factor V Leiden and prothrombin gene mutation testing was also negative. - He will continue anticoagulation for at least 6 months.  We will consider reduced intensity dosing of Xarelto 10 mg daily for prophylaxis after that. -I will also check Jak 2 mutation testing as this can cause DVTs, even when CBC is normal.  I will see him back in 6 months.

## 2017-07-29 NOTE — Progress Notes (Signed)
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  He presented to the emergency room on 07/17/2017 with right-sided lateral chest wall pain.  A CT scan of the abdomen and pelvis with contrast showed DVT involving the left internal iliac vein extending to the junction of the left common iliac vein.  A CT scan of the chest PE protocol showed pulmonary embolus for the lobar and segmental arteries in both lower lobes.  Small patchy infarcts were seen in both lower lobes.  Patient was admitted to the hospital and was started on intravenous heparin.  He was subsequently transitioned to Xarelto which he is taking twice a day now.  He is tolerating it very well.  He did not have any bleeding episodes with it.  His pleuritic chest pain has improved although he has some pressure-like sensation in the right lower lateral chest wall.  He gets easily short winded on minimal exertion.  Denies any bleeding per rectum or hematuria. Upon further questioning, he had a history of back pain shooting down his left lower extremity consistent with sciatica 4 weeks prior to presentation to the hospital.  He was bedridden for 3 days and later went to urgent care where they gave 2 shots which improved the pain.  He did get right shoulder pain 2 weeks after that incident which went away when he took [facility] powder.  However the pain came back on the day of presentation to the emergency room. He also gives a family history of lupus anticoagulant with multiple arterial and venous blood clots in his sister.    MEDICAL HISTORY:  History reviewed. No pertinent past medical history.  SURGICAL HISTORY: History reviewed. No pertinent surgical history.  SOCIAL HISTORY: Social History   Socioeconomic History  . Marital status: Single    Spouse name: Not on file  . Number of  children: Not on file  . Years of education: Not on file  . Highest education level: Not on file  Occupational History  . Not on file  Social Needs  . Financial resource strain: Not on file  . Food insecurity:    Worry: Not on file    Inability: Not on file  . Transportation needs:    Medical: Not on file    Non-medical: Not on file  Tobacco Use  . Smoking status: Never Smoker  . Smokeless tobacco: Never Used  Substance and Sexual Activity  . Alcohol use: Yes    Comment: once a month  . Drug use: No  . Sexual activity: Yes  Lifestyle  . Physical activity:    Days per week: Not on file    Minutes per session: Not on file  . Stress: Not on file  Relationships  . Social connections:    Talks on phone: Not on file    Gets together: Not on file    Attends religious service: Not on file    Active member of club or organization: Not on file    Attends meetings of clubs or organizations: Not on file    Relationship status: Not on file  . Intimate partner violence:    Fear of current or ex partner: Not on file    Emotionally abused: Not on file    Physically abused: Not on file    Forced sexual activity: Not on file  Other Topics  Concern  . Not on file  Social History Narrative  . Not on file    FAMILY HISTORY: History reviewed. No pertinent family history.  ALLERGIES:  has No Known Allergies.  MEDICATIONS:  Current Outpatient Medications  Medication Sig Dispense Refill  . Rivaroxaban 15 & 20 MG TBPK Take as directed on package: Start with one 56m tablet by mouth twice a day with food. On Day 22, switch to one 215mtablet once a day with food. 51 each 0   No current facility-administered medications for this visit.     REVIEW OF SYSTEMS:   Constitutional: Denies fevers, chills or abnormal night sweats.  Mild fatigue. Eyes: Denies blurriness of vision, double vision or watery eyes Ears, nose, mouth, throat, and face: Denies mucositis or sore throat Respiratory:  Denies any cough or hemoptysis.  Has dyspnea on exertion. Cardiovascular: Denies palpitation, chest discomfort or lower extremity swelling Gastrointestinal:  Denies nausea, heartburn or change in bowel habits Skin: Denies abnormal skin rashes Lymphatics: Denies new lymphadenopathy or easy bruising Neurological:Denies numbness, tingling or new weaknesses Behavioral/Psych: Mood is stable, no new changes  All other systems were reviewed with the patient and are negative.  PHYSICAL EXAMINATION: ECOG PERFORMANCE STATUS: 1 - Symptomatic but completely ambulatory  Vitals:   07/29/17 1220  BP: (!) 135/95  Pulse: 68  Resp: 16  Temp: 98.7 F (37.1 C)  SpO2: 100%   Filed Weights   07/29/17 1220  Weight: 196 lb 1.6 oz (89 kg)    GENERAL:alert, no distress and comfortable SKIN: skin color, texture, turgor are normal, no rashes or significant lesions EYES: normal, conjunctiva are pink and non-injected, sclera clear OROPHARYNX:no exudate, no erythema and lips, buccal mucosa, and tongue normal  NECK: supple, thyroid normal size, non-tender, without nodularity LYMPH:  no palpable lymphadenopathy in the cervical, axillary or inguinal LUNGS: clear to auscultation and percussion with normal breathing effort HEART: regular rate & rhythm and no murmurs and no lower extremity edema ABDOMEN:abdomen soft, non-tender and normal bowel sounds Musculoskeletal:no cyanosis of digits and no clubbing  PSYCH: alert & oriented x 3 with fluent speech NEURO: no focal motor/sensory deficits  LABORATORY DATA:  I have reviewed the data as listed Recent Results (from the past 2160 hour(s))  Comprehensive metabolic panel     Status: Abnormal   Collection Time: 07/17/17 10:34 PM  Result Value Ref Range   Sodium 137 135 - 145 mmol/L   Potassium 3.6 3.5 - 5.1 mmol/L   Chloride 98 98 - 111 mmol/L    Comment: Please note change in reference range.   CO2 30 22 - 32 mmol/L   Glucose, Bld 119 (H) 70 - 99 mg/dL     Comment: Please note change in reference range.   BUN 6 6 - 20 mg/dL    Comment: Please note change in reference range.   Creatinine, Ser 1.19 0.61 - 1.24 mg/dL   Calcium 9.5 8.9 - 10.3 mg/dL   Total Protein 8.6 (H) 6.5 - 8.1 g/dL   Albumin 4.3 3.5 - 5.0 g/dL   AST 17 15 - 41 U/L   ALT 22 0 - 44 U/L    Comment: Please note change in reference range.   Alkaline Phosphatase 120 38 - 126 U/L   Total Bilirubin 1.1 0.3 - 1.2 mg/dL   GFR calc non Af Amer >60 >60 mL/min   GFR calc Af Amer >60 >60 mL/min    Comment: (NOTE) The eGFR has been calculated using the CKD  EPI equation. This calculation has not been validated in all clinical situations. eGFR's persistently <60 mL/min signify possible Chronic Kidney Disease.    Anion gap 9 5 - 15    Comment: Performed at [facility], [address], [place], [place] [address]  Lipase, blood     Status: None   Collection Time: 07/17/17 10:34 PM  Result Value Ref Range   Lipase 31 11 - 51 U/L    Comment: Performed at [facility], [address], [place], [place] [address]  CBC with Differential     Status: Abnormal   Collection Time: 07/17/17 10:34 PM  Result Value Ref Range   WBC 12.3 (H) 4.0 - 10.5 K/uL   RBC 4.23 4.22 - 5.81 MIL/uL   Hemoglobin 15.1 13.0 - 17.0 g/dL   HCT 43.0 39.0 - 52.0 %   MCV 101.7 (H) 78.0 - 100.0 fL   MCH 35.7 (H) 26.0 - 34.0 pg   MCHC 35.1 30.0 - 36.0 g/dL   RDW 12.6 11.5 - 15.5 %   Platelets 230 150 - 400 K/uL   Neutrophils Relative % 68 %   Neutro Abs 8.3 (H) 1.7 - 7.7 K/uL   Lymphocytes Relative 24 %   Lymphs Abs 3.0 0.7 - 4.0 K/uL   Monocytes Relative 8 %   Monocytes Absolute 1.0 0.1 - 1.0 K/uL   Eosinophils Relative 0 %   Eosinophils Absolute 0.0 0.0 - 0.7 K/uL   Basophils Relative 0 %   Basophils Absolute 0.0 0.0 - 0.1 K/uL    Comment: Performed at [facility], [address], [place], [place] [address]  APTT     Status: None   Collection Time: 07/17/17 10:34 PM  Result Value Ref Range   aPTT 31 24 -  36 seconds    Comment: Performed at [facility], [address], [place], [place] [address]  Protime-INR     Status: None   Collection Time: 07/17/17 10:34 PM  Result Value Ref Range   Prothrombin Time 13.1 11.4 - 15.2 seconds   INR 1.00     Comment: Performed at [facility], [address], [place], [place] [address]  HIV antibody (Routine Testing)     Status: None   Collection Time: 07/18/17  2:34 AM  Result Value Ref Range   HIV Screen 4th Generation wRfx Non Reactive Non Reactive    Comment: (NOTE) Performed At: [facility] [place], [place] [id number] Rush Farmer MD [id number]   Antithrombin III     Status: None   Collection Time: 07/18/17  2:34 AM  Result Value Ref Range   AntiThromb III Func 103 75 - 120 %    Comment: Performed at [facility] [address] [place],  [address]  Protein C activity     Status: None   Collection Time: 07/18/17  2:34 AM  Result Value Ref Range   Protein C Activity 110 73 - 180 %    Comment: (NOTE) Performed At: [facility] [address] [place], [place] [id number] Rush Farmer MD [id number]   Protein C, total     Status: None   Collection Time: 07/18/17  2:34 AM  Result Value Ref Range   Protein C, Total 114 60 - 150 %    Comment: (NOTE) Performed At: [facility] [place], [place] [id number] Rush Farmer MD [id number]   Protein S activity     Status: None   Collection Time: 07/18/17  2:34 AM  Result Value Ref Range   Protein S Activity 110 63 - 140 %    Comment: (NOTE) Protein S activity may be falsely increased (masking an abnormal, low result) in patients receiving direct Xa inhibitor (e.g., rivaroxaban, apixaban, edoxaban) or a direct thrombin inhibitor (e.g., dabigatran) anticoagulant treatment due to assay interference by these drugs. Performed At: [facility] [place], [place] [id number] Rush Farmer MD  [id number]   Protein S, total     Status: None   Collection Time: 07/18/17  2:34 AM  Result Value Ref Range   Protein S Ag, Total 91 60 - 150 %    Comment: (NOTE) This test was developed and its performance characteristics determined by [facility]. It has not been cleared or approved by the Food and Drug Administration. Performed At: [facility] [place], [place] [id number] Rush Farmer MD UG:[id number]   Lupus anticoagulant panel     Status: Abnormal   Collection Time: 07/18/17  2:34 AM  Result Value Ref Range   PTT Lupus Anticoagulant 42.8 0.0 - 51.9 sec    Comment: (NOTE) Additional testing confirms the presence of heparin in the test sample. Results obtained after heparin neutralization.    DRVVT 50.3 (H) 0.0 - 47.0 sec   Lupus Anticoag Interp Comment:     Comment: (NOTE) No lupus anticoagulant was detected. An extended dRVVT that corrects on mixing with normal plasma can be caused by a deficiency of one of the common pathway factors (X, V, II or fibrinogen). Performed At: [facility] [place], [place] [id number] Rush Farmer MD [id number]   Beta-2-glycoprotein i abs, IgG/M/A     Status: None   Collection Time: 07/18/17  2:34 AM  Result Value Ref Range   Beta-2 Glyco I IgG <9 0 - 20 GPI IgG units    Comment: (NOTE) The reference interval reflects a 3SD or 99th percentile interval, which is thought to represent a potentially clinically significant result in accordance with the International Consensus Statement on the classification criteria for definitive antiphospholipid syndrome (APS). J Thromb Haem 2006;4:295-306.    Beta-2-Glycoprotein I IgM <9 0 - 32 GPI IgM units    Comment: (NOTE) The reference interval reflects a 3SD or 99th percentile interval, which is thought to represent a potentially clinically significant result in accordance with the International Consensus Statement on the classification criteria  for definitive antiphospholipid syndrome (APS). J Thromb Haem 2006;4:295-306. Performed At: [facility] [place], [place] [id number] Rush Farmer MD [id number]    Beta-2-Glycoprotein I IgA <9 0 - 25 GPI IgA units    Comment: (NOTE) The reference interval reflects a 3SD or 99th percentile interval, which is thought to represent a potentially clinically significant result in accordance with the International Consensus Statement on the classification criteria for definitive antiphospholipid syndrome (APS). J Thromb Haem 2006;4:295-306.   Homocysteine, serum     Status: Abnormal   Collection Time: 07/18/17  2:34 AM  Result Value Ref Range   Homocysteine 154.5 (H) 0.0 - 15.0 umol/L    Comment: (NOTE) Results confirmed on dilution. Performed At: [facility] [place], [place] [id number] Rush Farmer MD [id number]   Factor 5 leiden     Status: None   Collection Time: 07/18/17  2:34 AM  Result Value Ref Range   Recommendations-F5LEID: Comment     Comment: (NOTE) Result:  Negative (no mutation found) Factor V Leiden is a specific mutation (R506Q) in  the factor V gene that is associated with an increased risk of venous thrombosis. Factor V Leiden is more resistant to inactivation by activated protein C.  As a result, factor V persists in the circulation leading to a mild hyper- coagulable state.  The Leiden mutation accounts for 90% - 95% of APC resistance.  Factor V Leiden has been reported in patients with deep vein thrombosis, pulmonary embolus, central retinal vein occlusion, cerebral sinus thrombosis and hepatic vein thrombosis. Other risk factors to be considered in the workup for venous thrombosis include the G20210A mutation in the factor II (prothrombin) gene, protein S and C deficiency, and antithrombin deficiencies. Anticardiolipin antibody and lupus anticoagulant analysis may be appropriate for certain patients, as  well as homocysteine levels. Contact your local [facility] for information on how to order additi onal testing if desired. **Genetic counselors are available for health care providers to**  discuss results at 1-800-345-GENE [phone]). Methodology: DNA analysis of the Factor V gene was performed by allele-specific PCR. The diagnostic sensitivity and specificity is >99% for both. Molecular-based testing is highly accurate, but as in any laboratory test, diagnostic errors may occur. All test results must be combined with clinical information for the most accurate interpretation. This test was developed and its performance characteristics determined by [facility]. It has not been cleared or approved by the Food and Drug Administration. References: Voelkerding K (1996).  Clin Lab Med [id number]. Allison Quarry, PhD, [facility] Ruben Reason, PhD, [facility] Annetta Maw, M.S., PhD, [facility] Alfredo Bach, PhD, [facility] Norva Riffle, PhD, [facility] Earlean Polka PhD, [facility] Performed At: [facility] [place], [place] [id number] Nechama Guard MD SP:[id number]   Prothrombin gene mutation     Status: None   Collection Time: 07/18/17  2:34 AM  Result Value Ref Range   Recommendations-PTGENE: Comment     Comment: (NOTE) NEGATIVE No mutation identified. Comment: A point mutation (G20210A) in the factor II (prothrombin) gene is the second most common cause of inherited thrombophilia. The incidence of this mutation in the [place] Caucasian population is about 2% and in the [place] American population it is approximately 0.5%. This mutation is rare in the [place] and Native American population. Being heterozygous for a prothrombin mutation increases the risk for developing venous thrombosis about 2 to 3 times above the general population risk. Being homozygous for the prothrombin gene mutation increases the relative risk for venous thrombosis further, although it is not yet known how  much further the risk is increased. In women heterozygous for the prothrombin gene mutation, the use of estrogen containing oral contraceptives increases the relative risk of venous thrombosis about 16 times and the risk of developing cerebral thrombosis is also significantly increased. In pregnancy the pr othrombin gene mutation increases risk for venous thrombosis and may increase risk for stillbirth, placental abruption, pre-eclampsia and fetal growth restriction. If the patient possesses two or more congenital or acquired thrombophilic risk factors, the risk for thrombosis may rise to more than the sum of the risk ratios for the individual mutations. This assay detects only the prothrombin G20210A mutation and does not measure genetic abnormalities elsewhere in the genome. Other thrombotic risk factors may be pursued through systematic clinical laboratory analysis. These factors include the R506Q (Leiden) mutation in the Factor V gene, plasma homocysteine levels, as well as testing for deficiencies of antithrombin III, protein C and protein S. Genetic Counselors are available for health care providers to discuss results at 1-800-345-GENE [phone]). Methodology: DNA  analysis of the Factor II gene was performed by PCR amplification followed by restriction analysis. The di agnostic sensitivity is >99% for both. All the tests must be combined with clinical information for the most accurate interpretation. Molecular-based testing is highly accurate, but as in any laboratory test, diagnostic errors may occur. This test was developed and its performance characteristics determined by [facility]. It has not been cleared or approved by the Food and Drug Administration. Poort SR, et al. Blood. 1996; 91:6945-0388. Varga EA. Circulation. 2004; 828:M03-K91. Mervin Hack, et [place]; 19:700-703. Allison Quarry, PhD, [facility] Ruben Reason, PhD, [facility] Annetta Maw, M.S., PhD, [facility] Alfredo Bach, PhD, [facility] Norva Riffle, PhD, [facility] Earlean Polka, PhD, [facility] Performed At: [facility] [address] [place], [place] [id number] Nechama Guard MD [id number]   Cardiolipin antibodies, IgG, IgM, IgA     Status: None   Collection Time: 07/18/17  2:34 AM  Result Value Ref Range   Anticardiolipin IgG <9 0 - 14 GPL U/mL    Comment: (NOTE)                          Negative:              <15                          Indeterminate:     15 - 20                          Low-Med Positive: >20 - 80                          High Positive:         >80    Anticardiolipin IgM <9 0 - 12 MPL U/mL    Comment: (NOTE)                          Negative:              <13                          Indeterminate:     13 - 20                          Low-Med Positive: >20 - 80                          High Positive:         >80    Anticardiolipin IgA <9 0 - 11 APL U/mL    Comment: (NOTE)                          Negative:              <12                          Indeterminate:     12 - 20                          Low-Med Positive: >20 - 80  High Positive:         >80 Performed At: [facility] [place], [place] [id number] Rush Farmer MD [id number]   dRVVT Mix     Status: None   Collection Time: 07/18/17  2:34 AM  Result Value Ref Range   dRVVT Mix 43.5 0.0 - 47.0 sec    Comment: (NOTE) Performed At: [facility] [place], [place] [id number] Rush Farmer MD [id number]   Urinalysis, Routine w reflex microscopic     Status: Abnormal   Collection Time: 07/18/17  2:52 AM  Result Value Ref Range   Color, Urine YELLOW YELLOW   APPearance CLEAR CLEAR   Specific Gravity, Urine >1.046 (H) 1.005 - 1.030   pH 6.0 5.0 - 8.0   Glucose, UA NEGATIVE NEGATIVE mg/dL   Hgb urine dipstick NEGATIVE NEGATIVE   Bilirubin Urine NEGATIVE NEGATIVE   Ketones, ur 5 (A) NEGATIVE mg/dL    Protein, ur NEGATIVE NEGATIVE mg/dL   Nitrite NEGATIVE NEGATIVE   Leukocytes, UA NEGATIVE NEGATIVE    Comment: Performed at [facility], [address], [place], [place] [address]  MRSA PCR Screening     Status: None   Collection Time: 07/18/17  3:44 AM  Result Value Ref Range   MRSA by PCR NEGATIVE NEGATIVE    Comment:        The GeneXpert MRSA Assay (FDA approved for NASAL specimens only), is one component of a comprehensive MRSA colonization surveillance program. It is not intended to diagnose MRSA infection nor to guide or monitor treatment for MRSA infections. Performed at [facility], [address] [place], [place] [address]   Heparin level (unfractionated)     Status: None   Collection Time: 07/18/17  7:44 AM  Result Value Ref Range   Heparin Unfractionated 0.53 0.30 - 0.70 IU/mL    Comment: (NOTE) If heparin results are below expected values, and patient dosage has  been confirmed, suggest follow up testing of antithrombin III levels. Performed at [facility], [address] [place], [place] [address]   Basic metabolic panel     Status: Abnormal   Collection Time: 07/19/17  4:33 AM  Result Value Ref Range   Sodium 139 135 - 145 mmol/L   Potassium 3.8 3.5 - 5.1 mmol/L   Chloride 103 98 - 111 mmol/L    Comment: Please note change in reference range.   CO2 29 22 - 32 mmol/L   Glucose, Bld 121 (H) 70 - 99 mg/dL    Comment: Please note change in reference range.   BUN 10 6 - 20 mg/dL    Comment: Please note change in reference range.   Creatinine, Ser 1.16 0.61 - 1.24 mg/dL   Calcium 9.1 8.9 - 10.3 mg/dL   GFR calc non Af Amer >60 >60 mL/min   GFR calc Af Amer >60 >60 mL/min    Comment: (NOTE) The eGFR has been calculated using the CKD EPI equation. This calculation has not been validated in all clinical situations. eGFR's persistently <60 mL/min signify possible Chronic Kidney Disease.    Anion gap 7 5 - 15    Comment: Performed at [facility],  [address], [place],  [address]  CBC     Status: Abnormal   Collection Time: 07/19/17  4:33 AM  Result Value Ref Range   WBC 10.3 4.0 - 10.5 K/uL   RBC 3.85 (L) 4.22 - 5.81 MIL/uL   Hemoglobin 13.7 13.0 - 17.0 g/dL  HCT 39.0 39.0 - 52.0 %   MCV 101.3 (H) 78.0 - 100.0 fL   MCH 35.6 (H) 26.0 - 34.0 pg   MCHC 35.1 30.0 - 36.0 g/dL   RDW 12.6 11.5 - 15.5 %   Platelets 204 150 - 400 K/uL    Comment: Performed at [facility], [address], [place], [place] [address]  Heparin level (unfractionated)     Status: None   Collection Time: 07/19/17  4:33 AM  Result Value Ref Range   Heparin Unfractionated 0.43 0.30 - 0.70 IU/mL    Comment: (NOTE) If heparin results are below expected values, and patient dosage has  been confirmed, suggest follow up testing of antithrombin III levels. Performed at [facility], [address] [place], [place] [address]     RADIOGRAPHIC STUDIES: I have personally reviewed the radiological images as listed and agreed with the findings in the report. Dg Chest 2 View  Result Date: 07/17/2017 CLINICAL DATA:  37 year old male with cough. EXAM: CHEST - 2 VIEW COMPARISON:  Chest radiograph dated 05/16/2010 FINDINGS: Shallow inspiration. No focal consolidation, pleural effusion, or pneumothorax. The cardiac silhouette is within normal limits. No acute osseous pathology. IMPRESSION: No active cardiopulmonary disease. Electronically Signed   By: Anner Crete M.D.   On: 07/17/2017 23:31   Ct Angio Chest Pe W And/or Wo Contrast  Result Date: 07/18/2017 CLINICAL DATA:  37 y/o M; chest pain and nausea. Concern for acute pulmonary embolus. Positive DVT. EXAM: CT ANGIOGRAPHY CHEST WITH CONTRAST TECHNIQUE: Multidetector CT imaging of the chest was performed using the standard protocol during bolus administration of intravenous contrast. Multiplanar CT image reconstructions and MIPs were obtained to evaluate the vascular anatomy. CONTRAST:  136m ISOVUE-370 IOPAMIDOL  (ISOVUE-370) INJECTION 76% COMPARISON:  07/17/2017 CT of the abdomen and pelvis. FINDINGS: Cardiovascular: Satisfactory opacification of the pulmonary arteries to the segmental level. Multiple acute pulmonary emboli are present within bilateral lower lobe lobar and segmental pulmonary arteries. RV LV ratio equals 0.84. Mediastinum/Nodes: No enlarged mediastinal, hilar, or axillary lymph nodes. Thyroid gland, trachea, and esophagus demonstrate no significant findings. Lungs/Pleura: Peripheral ground-glass opacities in the lower lobes with areas of central sparing, likely pulmonary infarcts. No pleural effusion or pneumothorax. Upper Abdomen: No acute abnormality. Musculoskeletal: No chest wall abnormality. No acute or significant osseous findings. Review of the MIP images confirms the above findings. IMPRESSION: Positive for lobar and segmental acute PE in the lower lobes. (RV/LV Ratio = 0.84). 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 Critical Value/emergent results were called by telephone at the time of interpretation on 07/18/2017 at 1:31 am to Dr. PBetsey Holiday who verbally acknowledged these results. Electronically Signed   By: LKristine GarbeM.D.   On: 07/18/2017 01:34   Ct Abdomen Pelvis W Contrast  Result Date: 07/17/2017 CLINICAL DATA:  37year old male with right upper quadrant abdominal pain. EXAM: CT ABDOMEN AND PELVIS WITH CONTRAST TECHNIQUE: Multidetector CT imaging of the abdomen and pelvis was performed using the standard protocol following bolus administration of intravenous contrast. CONTRAST:  1086mISOVUE-300 IOPAMIDOL (ISOVUE-300) INJECTION 61% COMPARISON:  None. FINDINGS: Lower chest: There is a patchy area of airspace density at the right lung base which may represent developing infiltrate but concerning for an area of pulmonary infarct. Somewhat dilated and low attenuating vessel partially visualized (series 2 images 1-3) is concerning for pulmonary embolism. There is no  intra-abdominal free air or free fluid. Hepatobiliary: No focal liver abnormality is seen. No gallstones, gallbladder wall thickening, or biliary dilatation. Pancreas:  Unremarkable. No pancreatic ductal dilatation or surrounding inflammatory changes. Spleen: Normal in size without focal abnormality. Adrenals/Urinary Tract: Adrenal glands are unremarkable. Kidneys are normal, without renal calculi, focal lesion, or hydronephrosis. Bladder is unremarkable. Stomach/Bowel: Stomach is within normal limits. Appendix appears normal. No evidence of bowel wall thickening, distention, or inflammatory changes. Vascular/Lymphatic: There is a clot in the left internal iliac vein extending into the bifurcation of the left common iliac vein. 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 The IVC appears unremarkable as visualized. No clot noted in the IVC. The abdominal aorta is unremarkable. The SMV, splenic vein, and main portal vein are patent. No portal venous gas. There is no adenopathy. Reproductive: The prostate and seminal vesicles are grossly unremarkable. Partially visualized bilateral hydroceles. Other: None Musculoskeletal: No acute or significant osseous findings. IMPRESSION: 1. 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 2. An area of airspace density at the right lung base concerning for pulmonary infarct related to right lower lobe embolus. Evaluation of the visualized pulmonary arteries is very limited due to timing of the contrast and suboptimal opacification of these vessels. Further evaluation with V/Q scan or CT angiography of the chest recommended. These results were called by telephone at the time of interpretation on 07/17/2017 at 11:54 pm to Dr. Waverly Ferrari, who verbally acknowledged these results. Electronically  Signed   By: Anner Crete M.D.   On: 07/17/2017 23:56   US Venous Img Lower Bilateral  Result Date: 07/18/2017 CLINICAL DATA:  37 year old male with a history of PE. EXAM: BILATERAL LOWER EXTREMITY VENOUS DOPPLER ULTRASOUND TECHNIQUE: Gray-scale sonography with graded compression, as well as color Doppler and duplex ultrasound were performed to evaluate the lower extremity deep venous systems from the level of the common femoral vein and including the common femoral, femoral, profunda femoral, popliteal and calf veins including the posterior tibial, peroneal and gastrocnemius veins when visible. The superficial great saphenous vein was also interrogated. Spectral Doppler was utilized to evaluate flow at rest and with distal augmentation maneuvers in the common femoral, femoral and popliteal veins. COMPARISON:  None. FINDINGS: RIGHT LOWER EXTREMITY Common Femoral Vein: No evidence of thrombus. Normal compressibility, respiratory phasicity and response to augmentation. Saphenofemoral Junction: No evidence of thrombus. Normal compressibility and flow on color Doppler imaging. Profunda Femoral Vein: No evidence of thrombus. Normal compressibility and flow on color Doppler imaging. Femoral Vein: No evidence of thrombus. Normal compressibility, respiratory phasicity and response to augmentation. Popliteal Vein: No evidence of thrombus. Normal compressibility, respiratory phasicity and response to augmentation. Calf Veins: No evidence of thrombus. Normal compressibility and flow on color Doppler imaging. Superficial Great Saphenous Vein: No evidence of thrombus. Normal compressibility and flow on color Doppler imaging. Other Findings:  None. LEFT LOWER EXTREMITY Common Femoral Vein: No evidence of thrombus. Normal compressibility, respiratory phasicity and response to augmentation. Saphenofemoral Junction: No evidence of thrombus. Normal compressibility and flow on color Doppler imaging. Profunda Femoral Vein: No  evidence of thrombus. Normal compressibility and flow on color Doppler imaging. Femoral Vein: No evidence of thrombus. Normal compressibility, respiratory phasicity and response to augmentation. Popliteal Vein: No evidence of thrombus. Normal compressibility, respiratory phasicity and response to augmentation. Calf Veins: No evidence of thrombus. Normal compressibility and flow on color Doppler imaging. Superficial Great Saphenous Vein: No evidence of thrombus. Normal compressibility and flow on  color Doppler imaging. Other Findings:  None. IMPRESSION: Sonographic survey of the bilateral lower extremities negative for DVT Electronically Signed   By: Corrie Mckusick D.O.   On: 07/18/2017 10:23    ASSESSMENT & PLAN:  Pulmonary embolism ([place]) 1.  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 -A CT scan of the chest PE protocol on 07/18/2017 consistent with bilateral PE and bilateral lower lobe infarcts. -He was started on IV heparin which was transitioned to Xarelto.  He is tolerating it very well. - He gives history of left lower extremity sciatica and was completely bedridden for 3 days, 4 weeks prior to presentation to the ER.  Hence it can be considered as a weakly provoked DVT. - Lupus anticoagulant, anti-beta-2 glycoprotein 1 and anticardiolipin antibodies were negative.  Protein C, protein S, Antithrombin III, factor V Leiden and prothrombin gene mutation testing was also negative. - He will continue anticoagulation for at least 6 months.  We will consider reduced intensity dosing of Xarelto 10 mg daily for prophylaxis after that. -I will also check Jak 2 mutation testing as this can cause DVTs, even when CBC is normal.  I will see him back in 6  months.     All questions were answered. The patient knows to call the clinic with any problems, questions or concerns.     Derek Jack, MD 07/29/17 12:39 PM

## 2017-08-02 ENCOUNTER — Encounter (HOSPITAL_COMMUNITY): Payer: Self-pay | Admitting: *Deleted

## 2017-08-02 NOTE — Progress Notes (Signed)
FMLA papers completed and [facility] [employer]

## 2017-08-14 ENCOUNTER — Other Ambulatory Visit (HOSPITAL_COMMUNITY): Payer: Self-pay | Admitting: *Deleted

## 2017-08-14 ENCOUNTER — Other Ambulatory Visit (HOSPITAL_COMMUNITY): Payer: Self-pay | Admitting: Nurse Practitioner

## 2017-08-14 DIAGNOSIS — I82422 Acute embolism and thrombosis of left iliac vein: Secondary | ICD-10-CM

## 2017-08-14 MED ORDER — RIVAROXABAN 20 MG PO TABS: 20.0000 mg | ORAL_TABLET | Freq: Every day | ORAL | 1 refills | Status: DC

## 2017-08-16 ENCOUNTER — Other Ambulatory Visit (HOSPITAL_COMMUNITY): Payer: Self-pay | Admitting: Nurse Practitioner

## 2017-09-30 ENCOUNTER — Telehealth (HOSPITAL_COMMUNITY): Payer: Self-pay | Admitting: Hematology

## 2017-09-30 NOTE — Telephone Encounter (Signed)
Per Orie Rout RN pt can not afford the cost of his Xarelto. Filled out [employer] app waiting on pt to call back and provide tax forms.

## 2017-10-01 ENCOUNTER — Encounter (HOSPITAL_COMMUNITY): Payer: Self-pay | Admitting: *Deleted

## 2017-10-01 NOTE — Progress Notes (Signed)
Created in error

## 2017-10-04 ENCOUNTER — Encounter (HOSPITAL_COMMUNITY): Payer: Self-pay | Admitting: *Deleted

## 2017-10-04 NOTE — Progress Notes (Signed)
Patient was unable to get [place] his financial information this week for assistance with copay for Xarelto so orders received from Dr. Ellin Saba  Refer to coumadin clinic to bridge back to Xarelto. Plan to still try and get assistance with Xarelto and let the coumadin clinic physician determine when patient should resume that medication. Patient is to keep appointments with [place] in January as scheduled.  I called and left voicemail and advised of the above. I asked that they return our call to let us know of any questions or concerns.

## 2017-10-10 ENCOUNTER — Telehealth (HOSPITAL_COMMUNITY): Payer: Self-pay | Admitting: *Deleted

## 2017-10-10 ENCOUNTER — Ambulatory Visit: Payer: BC Managed Care – PPO | Admitting: Cardiovascular Disease

## 2017-10-10 NOTE — Telephone Encounter (Signed)
LMOM that pt has an appointment today in [place] at 2:40 and if he could call us back and let us know that he got the message and he is going to make it to the appointment.

## 2017-10-22 ENCOUNTER — Ambulatory Visit: Payer: BC Managed Care – PPO | Admitting: Cardiology

## 2017-11-25 ENCOUNTER — Encounter: Payer: Self-pay | Admitting: *Deleted

## 2017-11-26 ENCOUNTER — Ambulatory Visit (INDEPENDENT_AMBULATORY_CARE_PROVIDER_SITE_OTHER): Payer: Self-pay | Admitting: *Deleted

## 2017-11-26 ENCOUNTER — Ambulatory Visit: Payer: Self-pay | Admitting: Cardiology

## 2017-11-26 ENCOUNTER — Encounter

## 2017-11-26 DIAGNOSIS — Z5181 Encounter for therapeutic drug level monitoring: Secondary | ICD-10-CM

## 2017-11-26 DIAGNOSIS — I82422 Acute embolism and thrombosis of left iliac vein: Secondary | ICD-10-CM

## 2017-11-26 DIAGNOSIS — I2782 Chronic pulmonary embolism: Secondary | ICD-10-CM

## 2017-12-10 NOTE — Progress Notes (Signed)
[facility] to change from Xarelto to warfarin due to cost.  Upon questioning patient he has not been on any anticoagulation for several months.  Has canceled 2 appts with cardiologist.  Explained risks and benefits of both medications.  Pt would prefer to stay on Xarelto if at all possible.  Pt did provide financial information to Deneise Leveriane Wilson RN in the cancer center to see if the pt qualified for pt assistance. (This was put on hold since they thought he was starting warfarin)   Pt states he will have a new job in January with insurance and should be able to afford Xarelto at that time.   Gave pt 2 weeks of Xarelto 20mg  samples to get him back on anticoagulation.  Called Diane Andrey [facility] RN [facility].  She still has all of pt's information and will go ahead and forward to April MansonAngela Denny (their financial counselor) to get pt assistance application processed.    Pt/Sheree (sign other) will be notified when we here back from pt assistance.  They are to call for more samples before running out of Xarelto.  12/03/17  LM on voicemail of Feliz Beamravis and Sheree's cell phone that I had more Xarelto samples for him.  Spoke to [place]ree on 12/5 and told they could pick up the samples from the [place] office.  She verbalized understanding. (Xarelto 20mg   Lot [id number]  Exp 10/2019  Lot [id number]  Exp 07/2019

## 2017-12-20 ENCOUNTER — Telehealth (HOSPITAL_COMMUNITY): Payer: Self-pay | Admitting: Hematology

## 2017-12-20 NOTE — Telephone Encounter (Signed)
FAXED XARELTO APP TO [employer]

## 2017-12-24 ENCOUNTER — Telehealth: Payer: Self-pay | Admitting: *Deleted

## 2017-12-24 NOTE — Telephone Encounter (Signed)
-----   Message from Mickie Bailiane G Wilson, RN sent at 12/03/2017 10:45 AM EST ----- Misty StanleyLisa,   Thank you so much. I will let Angie know to work on assistance.  If you talk with the patient can you get updated phone numbers for him because the ones we have in Epic don't seem to be able to reach him.   Thank you and I will let you know when we find out.   ----- Message ----- From: Louanna Raweid, Connelly [place] M, RN Sent: 12/03/2017  10:33 AM EST To: Mickie Bailiane G Wilson, RN  Plan is to keep him on xarelto 20mg  daily until he sees Dr Kirtland BouchardK back in Jan.  At that time he can decide if he wants to keep pt on 20mg  x 6 more months (since he was off for 2 months) or decrease dose to 10mg  daily long term as mentioned in his previous note.  Let's go ahead and pursue assistance to see if he qualifies.  If so, that would be great.  He's not sure what type of insurance he will have when he starts this new job in Jan.  Keep me posted.  I will try to keep him in samples until we find out something. Let me know if I need to do anything. I will let him know our plan. Misty StanleyLisa ----- Message ----- From: Mickie BailWilson, Diane G, RN Sent: 12/03/2017   9:24 AM EST To: Louanna RawLisa M Parnell Spieler, RN, Trecia RogersAngela M Denny  Zayin Valadez,   We had put the xarelto assistance on hold because patient was to be seen back in October in your clinic and start on coumadin and then whenever you all wanted to bridge him back to xarelto we were going to pursue getting him assistance. He did send me the financial information but I forwarded it to April MansonAngela Denny, our Artistfinancial counselor.   If you can let me know what your care plan for him looks like so we will know. With him getting insurance in January, do we need to pursue assistance coverage still?  Thank you.    ----- Message ----- From: Louanna Raweid, Jas Betten M, RN Sent: 12/03/2017   7:42 AM EST To: Mickie Bailiane G Wilson, RN  Hi Diane, I saw pt in coumadin clinic last week.  He has not been on any anticoagulation in several months.  Discussed coumadin vs xarelto.  Pt  prefers to go back on xarelto.  He said he had given you all the financial information to apply for patient assistance??  Has this been sent off or do you still have the info so we can proceed?  He will have insurance starting in January.  Gave him samples to get him back on it. Thanks, Misty StanleyLisa

## 2018-01-03 ENCOUNTER — Ambulatory Visit: Payer: Self-pay | Admitting: Cardiovascular Disease

## 2018-01-10 ENCOUNTER — Telehealth (HOSPITAL_COMMUNITY): Payer: Self-pay | Admitting: Hematology

## 2018-01-10 NOTE — Telephone Encounter (Signed)
pc to [employer] regarding pts application of xarelto. Pending pts call to them with financial info.

## 2018-01-22 ENCOUNTER — Inpatient Hospital Stay (HOSPITAL_COMMUNITY): Payer: Self-pay

## 2018-01-29 ENCOUNTER — Ambulatory Visit (HOSPITAL_COMMUNITY): Payer: BC Managed Care – PPO | Admitting: Hematology

## 2019-10-06 IMAGING — CT CT ANGIO CHEST
2 of 6 series · 19 of 36 positions shown · IV contrast (Isovue)
Comparison: 07/17/2017 CT of the abdomen and pelvis.

CLINICAL DATA: 36 y/o M; chest pain and nausea. Concern for acute
pulmonary embolus. Positive DVT.

EXAM:
CT ANGIOGRAPHY CHEST WITH CONTRAST
TECHNIQUE: Multidetector CT imaging of the chest was performed using the
standard protocol during bolus administration of intravenous
contrast. Multiplanar CT image reconstructions and MIPs were
obtained to evaluate the vascular anatomy.
CONTRAST:  100mL Q7KCE2-ZW2 IOPAMIDOL ([id number]-ZW2) INJECTION 76%

[Series 5: thins · axial · 0.74mm/px · z∈[+1336,+1561]mm · 18 of 251 slices shown]
[im 13/251  lung]
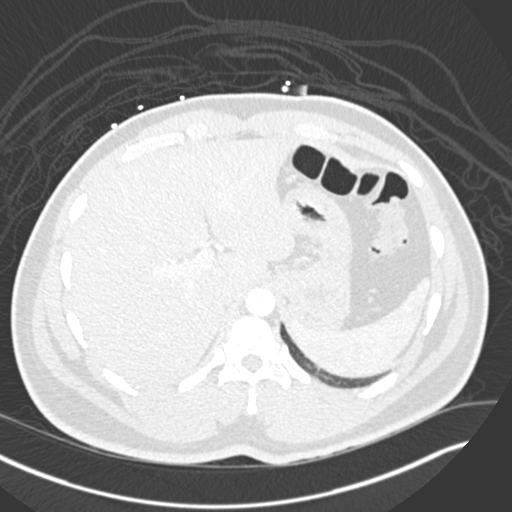
[im 26/251  mediastinal]
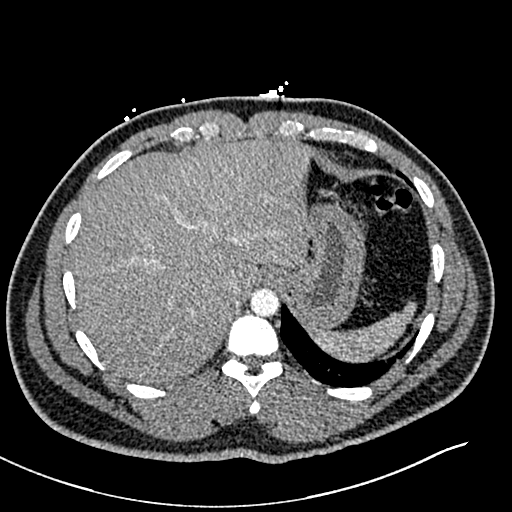
[im 38/251  lung]
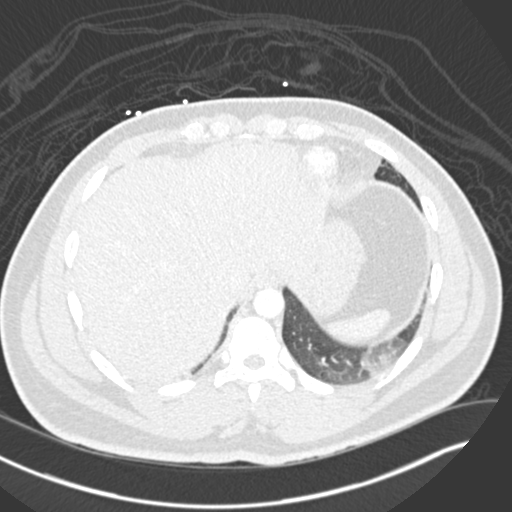
[im 51/251  mediastinal]
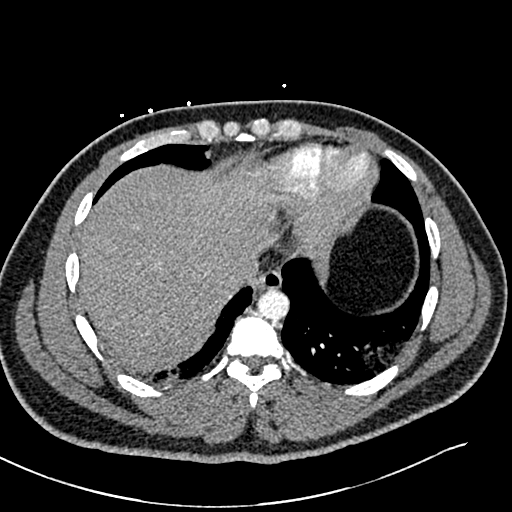
[im 63/251  lung]
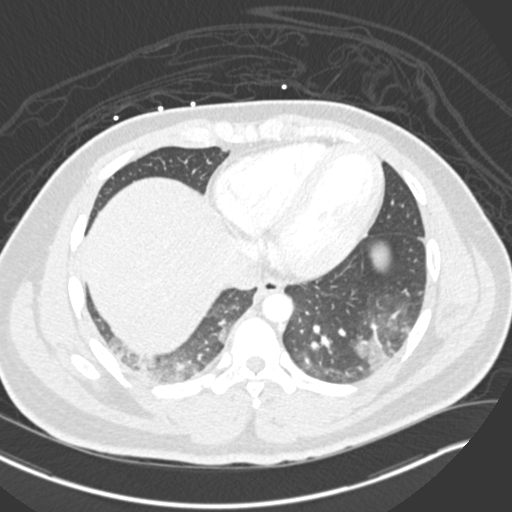
[im 76/251  mediastinal]
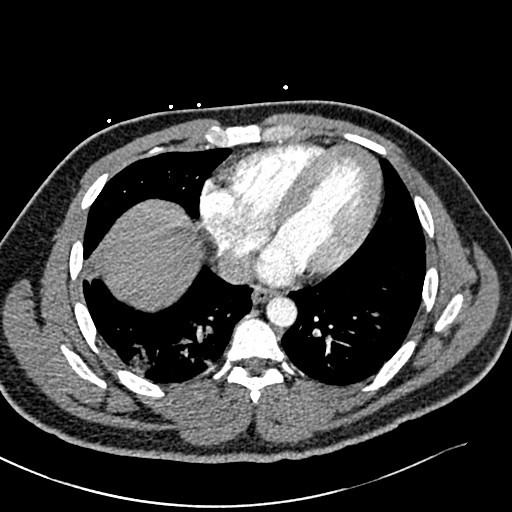
[im 88/251  lung]
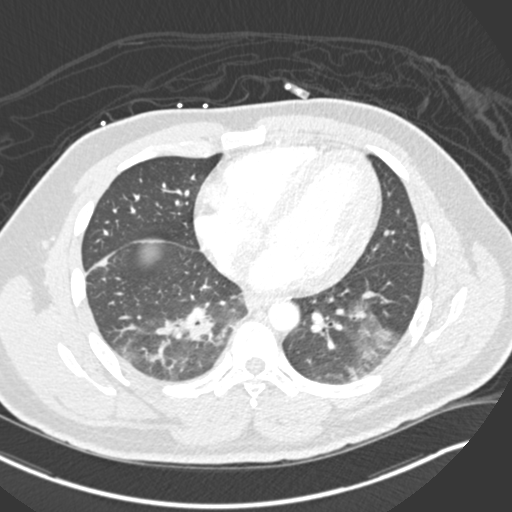
[im 101/251  mediastinal]
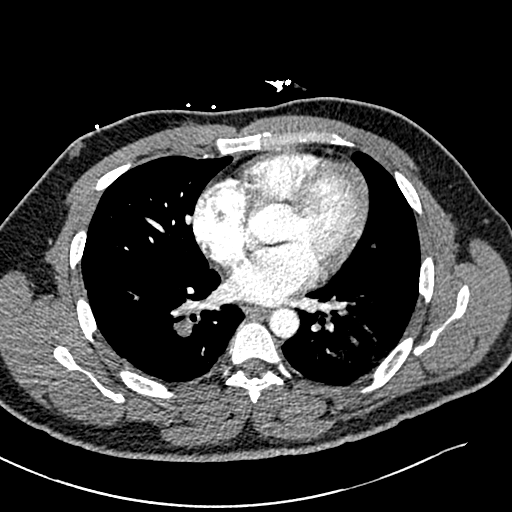
[im 113/251  lung]
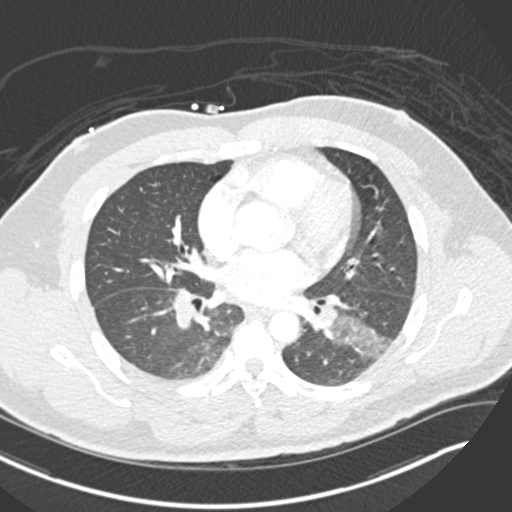
[im 138/251  mediastinal]
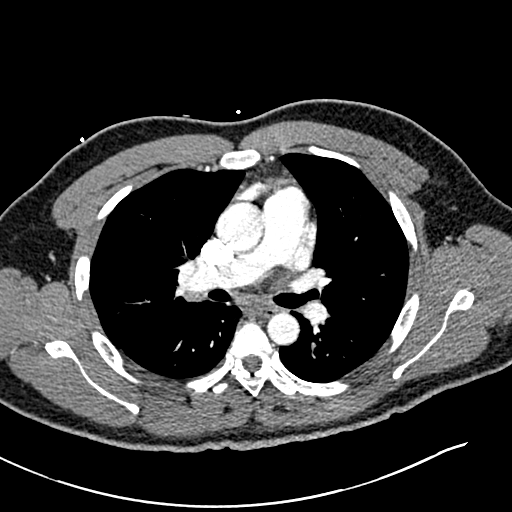
[im 151/251  lung]
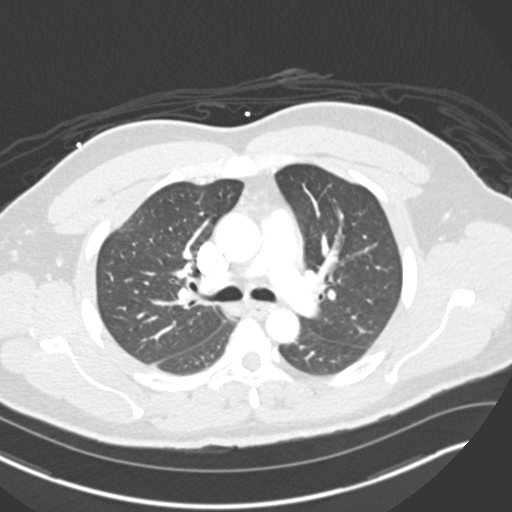
[im 163/251  mediastinal]
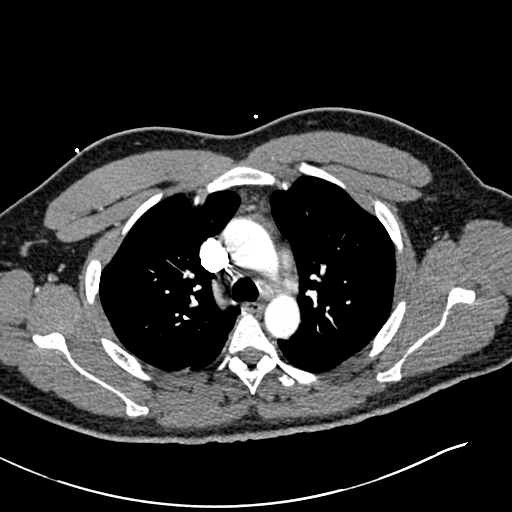
[im 176/251  lung]
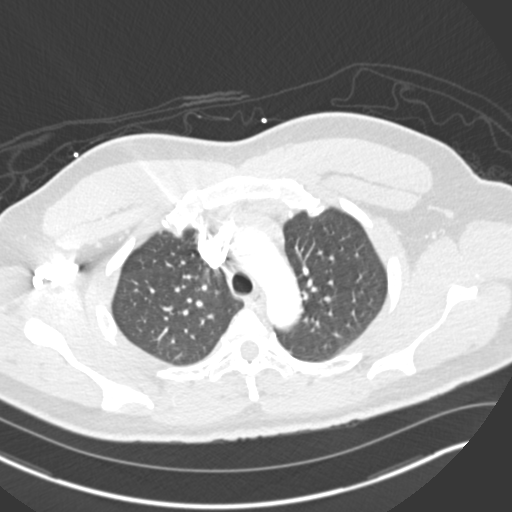
[im 188/251  mediastinal]
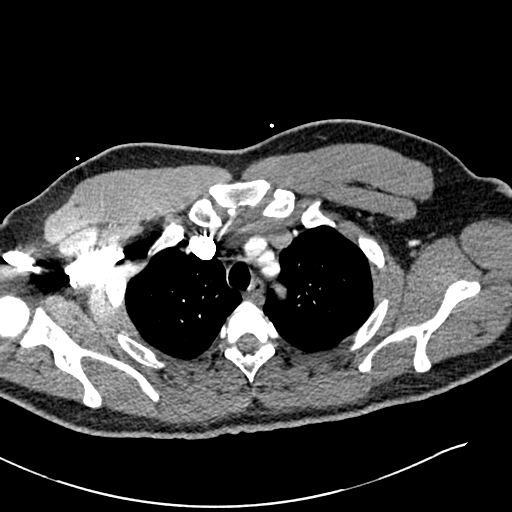
[im 201/251  lung]
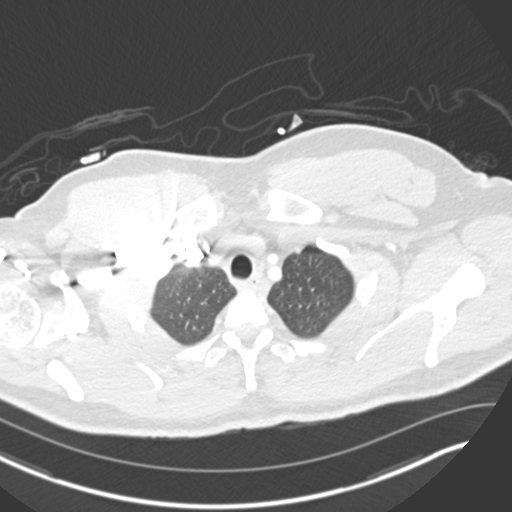
[im 213/251  mediastinal]
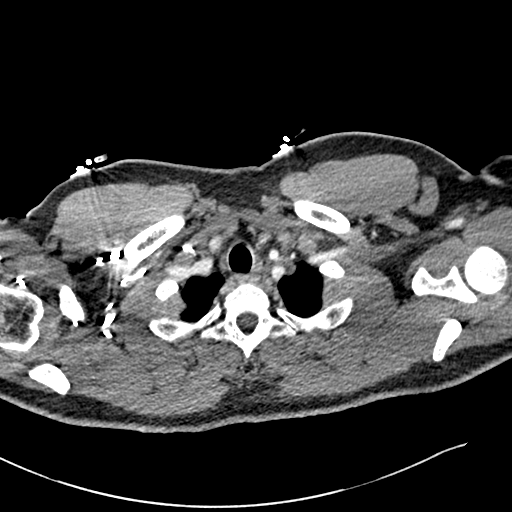
[im 226/251  lung]
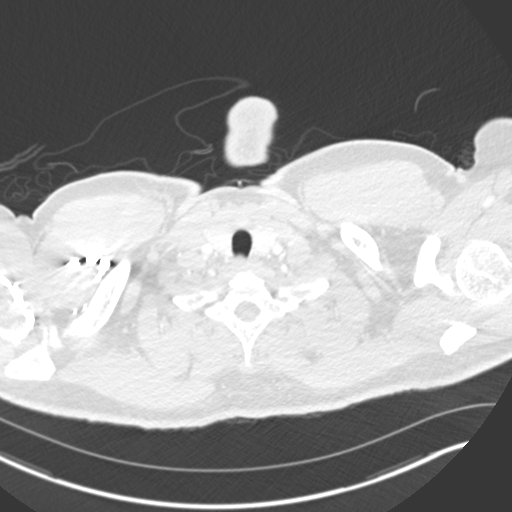
[im 238/251  mediastinal]
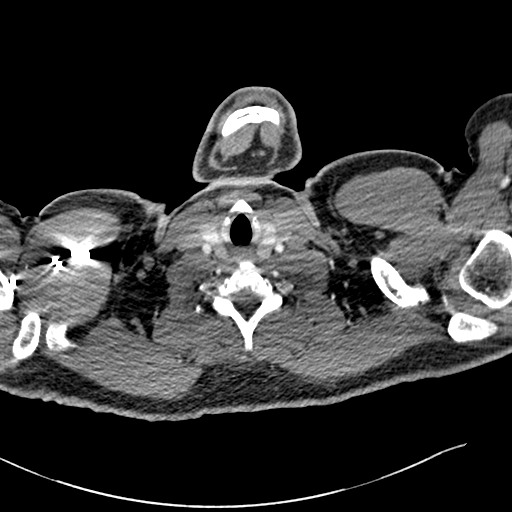

[Series 7: coronal mpr · coronal · 0.52mm/px · 1 of 140 slices shown]
[im 70/140  mediastinal]
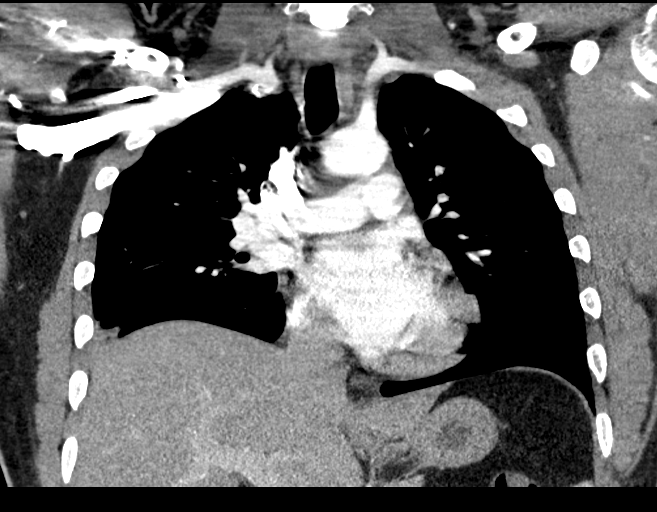

[19 of 36 positions shown; findings below may reference images not displayed]

FINDINGS: Cardiovascular: Satisfactory opacification of the pulmonary arteries
to the segmental level. Multiple acute pulmonary emboli are present
within bilateral lower lobe lobar and segmental pulmonary arteries.
RV LV ratio equals 0.84.

Mediastinum/Nodes: No enlarged mediastinal, hilar, or axillary lymph
nodes. Thyroid gland, trachea, and esophagus demonstrate no
significant findings.

Lungs/Pleura: Peripheral ground-glass opacities in the lower lobes
with areas of central sparing, likely pulmonary infarcts. No pleural
effusion or pneumothorax.

Upper Abdomen: No acute abnormality.

Musculoskeletal: No chest wall abnormality. No acute or significant
osseous findings.

Review of the MIP images confirms the above findings.
IMPRESSION: Positive for lobar and segmental acute PE in the lower lobes. (RV/LV
Ratio = 0.84). Small pulmonary infarcts in the lower lobes.

Critical Value/emergent results were called by telephone at the time
of interpretation on 07/18/2017 at [DATE] to Dr. Amnon, who
verbally acknowledged these results.

By: Shalley Jim M.D.

## 2019-11-03 ENCOUNTER — Telehealth (HOSPITAL_COMMUNITY): Payer: Self-pay | Admitting: Surgery

## 2019-11-03 ENCOUNTER — Other Ambulatory Visit (HOSPITAL_COMMUNITY): Payer: Self-pay | Admitting: Surgery

## 2019-11-03 DIAGNOSIS — I82422 Acute embolism and thrombosis of left iliac vein: Secondary | ICD-10-CM

## 2019-11-03 NOTE — Telephone Encounter (Signed)
Pt had called earlier and stated that he was a former pt of Dr. Ellin Saba and wanted to know if he was ok to get his Covid vaccine.  The pt also stated that he had stopped taking his Xarelto awhile ago due to a lapse in health insurance.  He wanted to know if he needed to be seen by our office or if he could refill the Xarelto.  Per Dr. Ellin Saba, the pt needs to be seen again in the office and have labs draw.  Also, Dr. Ellin Saba stated that the pt was fine to receive his Covid vaccines.  I called the pt to let him know that his lab appointment was scheduled for November 23 at 2:00, and that his appointment with Dr. Ellin Saba was scheduled for December 1 at 2:15.  Pt verbalized understanding and was told to call our office if he had any questions.

## 2019-11-24 ENCOUNTER — Other Ambulatory Visit: Payer: Self-pay

## 2019-11-24 ENCOUNTER — Inpatient Hospital Stay (HOSPITAL_COMMUNITY): Payer: BC Managed Care – PPO | Attending: Hematology

## 2019-11-24 DIAGNOSIS — Z86718 Personal history of other venous thrombosis and embolism: Secondary | ICD-10-CM | POA: Diagnosis not present

## 2019-11-24 DIAGNOSIS — I82422 Acute embolism and thrombosis of left iliac vein: Secondary | ICD-10-CM

## 2019-11-24 LAB — CBC WITH DIFFERENTIAL/PLATELET
Abs Immature Granulocytes: 0.03 10*3/uL (ref 0.00–0.07)
Basophils Absolute: 0 10*3/uL (ref 0.0–0.1)
Basophils Relative: 0 %
Eosinophils Absolute: 0.2 10*3/uL (ref 0.0–0.5)
Eosinophils Relative: 3 %
HCT: 39.8 % (ref 39.0–52.0)
Hemoglobin: 13.7 g/dL (ref 13.0–17.0)
Immature Granulocytes: 1 %
Lymphocytes Relative: 37 %
Lymphs Abs: 2 10*3/uL (ref 0.7–4.0)
MCH: 35.2 pg — ABNORMAL HIGH (ref 26.0–34.0)
MCHC: 34.4 g/dL (ref 30.0–36.0)
MCV: 102.3 fL — ABNORMAL HIGH (ref 80.0–100.0)
Monocytes Absolute: 0.4 10*3/uL (ref 0.1–1.0)
Monocytes Relative: 7 %
Neutro Abs: 2.8 10*3/uL (ref 1.7–7.7)
Neutrophils Relative %: 52 %
Platelets: 234 10*3/uL (ref 150–400)
RBC: 3.89 MIL/uL — ABNORMAL LOW (ref 4.22–5.81)
RDW: 12.9 % (ref 11.5–15.5)
WBC: 5.4 10*3/uL (ref 4.0–10.5)
nRBC: 0 % (ref 0.0–0.2)

## 2019-11-24 LAB — COMPREHENSIVE METABOLIC PANEL
ALT: 23 U/L (ref 0–44)
AST: 21 U/L (ref 15–41)
Albumin: 4.1 g/dL (ref 3.5–5.0)
Alkaline Phosphatase: 96 U/L (ref 38–126)
Anion gap: 7 (ref 5–15)
BUN: <5 mg/dL — ABNORMAL LOW (ref 6–20)
CO2: 28 mmol/L (ref 22–32)
Calcium: 9.5 mg/dL (ref 8.9–10.3)
Chloride: 105 mmol/L (ref 98–111)
Creatinine, Ser: 1.17 mg/dL (ref 0.61–1.24)
GFR, Estimated: >60 mL/min (ref >60)
Glucose, Bld: 106 mg/dL — ABNORMAL HIGH (ref 70–99)
Potassium: 4.1 mmol/L (ref 3.5–5.1)
Sodium: 140 mmol/L (ref 135–145)
Total Bilirubin: 0.4 mg/dL (ref 0.3–1.2)
Total Protein: 7.9 g/dL (ref 6.5–8.1)

## 2019-11-24 LAB — D-DIMER, QUANTITATIVE (NOT AT ARMC): D-Dimer, Quant: 3.44 ug/mL-FEU — ABNORMAL HIGH (ref 0.00–0.50)

## 2019-12-02 ENCOUNTER — Encounter (HOSPITAL_COMMUNITY): Payer: Self-pay | Admitting: Hematology

## 2019-12-02 ENCOUNTER — Inpatient Hospital Stay (HOSPITAL_COMMUNITY): Payer: BC Managed Care – PPO | Attending: Hematology | Admitting: Hematology

## 2019-12-02 ENCOUNTER — Other Ambulatory Visit: Payer: Self-pay

## 2019-12-02 VITALS — BP 145/102 | HR 75 | Resp 16 | Wt 202.6 lb

## 2019-12-02 DIAGNOSIS — Z86718 Personal history of other venous thrombosis and embolism: Secondary | ICD-10-CM | POA: Diagnosis present

## 2019-12-02 DIAGNOSIS — I2699 Other pulmonary embolism without acute cor pulmonale: Secondary | ICD-10-CM

## 2019-12-02 DIAGNOSIS — Z86711 Personal history of pulmonary embolism: Secondary | ICD-10-CM | POA: Diagnosis not present

## 2019-12-02 DIAGNOSIS — I82422 Acute embolism and thrombosis of left iliac vein: Secondary | ICD-10-CM

## 2019-12-02 DIAGNOSIS — Z803 Family history of malignant neoplasm of breast: Secondary | ICD-10-CM | POA: Insufficient documentation

## 2019-12-02 DIAGNOSIS — Z7901 Long term (current) use of anticoagulants: Secondary | ICD-10-CM | POA: Insufficient documentation

## 2019-12-02 DIAGNOSIS — Z8042 Family history of malignant neoplasm of prostate: Secondary | ICD-10-CM | POA: Diagnosis not present

## 2019-12-02 MED ORDER — RIVAROXABAN 20 MG PO TABS: 20.0000 mg | ORAL_TABLET | Freq: Every day | ORAL | 3 refills | Status: DC

## 2019-12-02 NOTE — Patient Instructions (Signed)
[facility] Discharge Instructions  You were seen today by Dr. Ellin Saba. He went over your recent results. You will be prescribed Xarelto 20 mg to take daily. Dr. Ellin Saba will see you back in 6 months for labs and follow up.   Thank you for choosing [facility] at [facility] to provide your oncology and hematology care.  To afford each patient quality time with our provider, please arrive at least 15 minutes before your scheduled appointment time.   If you have a lab appointment with the Cancer Center please come in thru the Main Entrance and check in at the main information desk  You need to re-schedule your appointment should you arrive 10 or more minutes late.  We strive to give you quality time with our providers, and arriving late affects you and other patients whose appointments are after yours.  Also, if you no show three or more times for appointments you may be dismissed from the clinic at the providers discretion.     Again, thank you for choosing [facility].  Our hope is that these requests will decrease the amount of time that you wait before being seen by our physicians.       _____________________________________________________________  Should you have questions after your visit to [facility], please contact our office at [phone] between the hours of 8:00 a.m. and 4:30 p.m.  Voicemails left after 4:00 p.m. will not be returned until the following business day.  For prescription refill requests, have your pharmacy contact our office and allow 72 hours.    Cancer Center Support Programs:   > Cancer Support Group  2nd Tuesday of the month 1pm-2pm, Journey Room

## 2019-12-02 NOTE — Progress Notes (Signed)
[facility] [address] [place], [place] [address]   CLINIC:  Medical Oncology/Hematology  PCP:  Patient, No Pcp Per None  None  REASON FOR VISIT:  Follow-up for DVT of left leg and bilateral PE  PRIOR THERAPY: Xarelto   CURRENT THERAPY: Xarelto 20 mg QD INTERVAL HISTORY:  Steven Knox, a 39 y.o. male, returns for routine follow-up for his DVT of left leg and bilateral PE. Micah was last seen on 07/29/2017.  Today he reports that he has not taken Xarelto since 09/2018 because it was too expensive to pay out of pocket. He denies having any pain in either leg, pleuritic CP or SOB. He has been exercising more and drinks ginger and lemon tea regularly.  He has recently become employed and is back on insurance.   REVIEW OF SYSTEMS:  Review of Systems  Constitutional: Negative for appetite change and fatigue.  Respiratory: Negative for shortness of breath.   Cardiovascular: Negative for chest pain.  Gastrointestinal: Positive for nausea and vomiting.  Musculoskeletal: Negative for myalgias.  Neurological: Positive for headaches (d/t sinus congestion).  All other systems reviewed and are negative.   PAST MEDICAL/SURGICAL HISTORY:  Past Medical History:  Diagnosis Date  . DVT (deep venous thrombosis) (HCC)    Left internal iliac vein extending to the junction of the left common iliac vein - July 2019  . Pulmonary embolus [facility])    July 2019   Past Surgical History:  Procedure Laterality Date  . NO PAST SURGERIES      SOCIAL HISTORY:  Social History   Socioeconomic History  . Marital status: Single    Spouse name: Not on file  . Number of children: Not on file  . Years of education: Not on file  . Highest education level: Not on file  Occupational History  . Not on file  Tobacco Use  . Smoking status: Never Smoker  . Smokeless tobacco: Never Used  Substance and Sexual Activity  . Alcohol use: Not Currently    Comment: once a month  . Drug  use: No  . Sexual activity: Yes  Other Topics Concern  . Not on file  Social History Narrative  . Not on file   Social Determinants of Health   Financial Resource Strain: Low Risk   . Difficulty of Paying Living Expenses: Not hard at all  Food Insecurity: No Food Insecurity  . Worried About Programme researcher, broadcasting/film/video in the Last Year: Never true  . Ran Out of Food in the Last Year: Never true  Transportation Needs: No Transportation Needs  . Lack of Transportation (Medical): No  . Lack of Transportation (Non-Medical): No  Physical Activity: Sufficiently Active  . Days of Exercise per Week: 3 days  . Minutes of Exercise per Session: 60 min  Stress: No Stress Concern Present  . Feeling of Stress : Not at all  Social Connections: Moderately Isolated  . Frequency of Communication with Friends and Family: Twice a week  . Frequency of Social Gatherings with Friends and Family: Once a week  . Attends Religious Services: Never  . Active Member of Clubs or Organizations: Yes  . Attends Banker Meetings: 1 to 4 times per year  . Marital Status: Never married  Intimate Partner Violence: Not At Risk  . Fear of Current or Ex-Partner: No  . Emotionally Abused: No  . Physically Abused: No  . Sexually Abused: No    FAMILY HISTORY:  Family History  Problem Relation Age of Onset  . Lupus Sister   . Deep vein thrombosis Sister   . Cancer Father        Lymph nodes  . Breast cancer Mother   . Breast cancer Maternal Grandmother   . Cancer Maternal Grandfather   . Prostate cancer Paternal Grandfather     CURRENT MEDICATIONS:  Current Outpatient Medications  Medication Sig Dispense Refill  . rivaroxaban (XARELTO) 20 MG TABS tablet Take 1 tablet (20 mg total) by mouth daily with supper. 90 tablet 3   No current facility-administered medications for this visit.    ALLERGIES:  No Known Allergies  PHYSICAL EXAM:  Performance status (ECOG): 1 - Symptomatic but completely  ambulatory  Vitals:   12/02/19 1434  BP: (!) 145/102  Pulse: 75  Resp: 16  SpO2: 100%   Wt Readings from Last 3 Encounters:  12/02/19 202 lb 9.6 oz (91.9 kg)  07/29/17 196 lb 1.6 oz (89 kg)  07/17/17 210 lb (95.3 kg)   Physical Exam Vitals reviewed.  Constitutional:      Appearance: Normal appearance.  Cardiovascular:     Rate and Rhythm: Normal rate and regular rhythm.     Pulses: Normal pulses.     Heart sounds: Normal heart sounds.  Pulmonary:     Effort: Pulmonary effort is normal.     Breath sounds: Normal breath sounds.  Musculoskeletal:        General: No swelling.     Right lower leg: No swelling or tenderness. No edema.     Left lower leg: No swelling or tenderness. No edema.  Neurological:     General: No focal deficit present.     Mental Status: He is alert and oriented to person, place, and time.  Psychiatric:        Mood and Affect: Mood normal.        Behavior: Behavior normal.     LABORATORY DATA:  I have reviewed the labs as listed.  CBC Latest Ref Rng & Units 11/24/2019 07/19/2017 07/17/2017  WBC 4.0 - 10.5 K/uL 5.4 10.3 12.3(H)  Hemoglobin 13.0 - 17.0 g/dL 41.2 87.8 67.6  Hematocrit 39 - 52 % 39.8 39.0 43.0  Platelets 150 - 400 K/uL 234 204 230   CMP Latest Ref Rng & Units 11/24/2019 07/19/2017 07/17/2017  Glucose 70 - 99 mg/dL 720(N) 470(J) 628(Z)  BUN 6 - 20 mg/dL <6(O) 10 6  Creatinine 0.61 - 1.24 mg/dL 2.94 7.65 4.65  Sodium 135 - 145 mmol/L 140 139 137  Potassium 3.5 - 5.1 mmol/L 4.1 3.8 3.6  Chloride 98 - 111 mmol/L 105 103 98  CO2 22 - 32 mmol/L 28 29 30   Calcium 8.9 - 10.3 mg/dL 9.5 9.1 9.5  Total Protein 6.5 - 8.1 g/dL 7.9 - 8.6(H)  Total Bilirubin 0.3 - 1.2 mg/dL 0.4 - 1.1  Alkaline Phos 38 - 126 U/L 96 - 120  AST 15 - 41 U/L 21 - 17  ALT 0 - 44 U/L 23 - 22      Component Value Date/Time   RBC 3.89 (L) 11/24/2019 1404   MCV 102.3 (H) 11/24/2019 1404   MCH 35.2 (H) 11/24/2019 1404   MCHC 34.4 11/24/2019 1404   RDW 12.9  11/24/2019 1404   LYMPHSABS 2.0 11/24/2019 1404   MONOABS 0.4 11/24/2019 1404   EOSABS 0.2 11/24/2019 1404   BASOSABS 0.0 11/24/2019 1404    DIAGNOSTIC IMAGING:  I have independently reviewed the scans and discussed with the  patient. No results found.   ASSESSMENT:  1.  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 -A CT scan of the chest PE protocol on 07/18/2017 consistent with bilateral PE and bilateral lower lobe infarcts. -He was started on IV heparin which was transitioned to Xarelto.  He is tolerating it very well. - He gives history of left lower extremity sciatica and was completely bedridden for 3 days, 4 weeks prior to presentation to the ER.  Hence it can be considered as a weakly provoked DVT. - Lupus anticoagulant, anti-beta-2 glycoprotein 1 and anticardiolipin antibodies were negative.  Protein C, protein S, Antithrombin III, factor V Leiden and prothrombin gene mutation testing was also negative.   PLAN:  1.  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 -He apparently lost insurance and also stopped taking Xarelto few months ago. -Does not report any pain in the left leg or signs or symptoms of DVT and PE. -Reviewed his labs today.  D-dimer was elevated at 3.44. -He has insurance now.  Recommend starting back on Xarelto 20 mg daily. -Plan to see him back in 6 months with repeat labs including D-dimer. -We will plan to repeat scan if D-dimer normalized.  Orders placed this encounter:  No orders of the defined types were placed in this encounter.    Doreatha Massed, MD [facility] [phone]   I, Drue Second, am acting  as a scribe for Dr. Payton Mccallum.  I, Doreatha Massed MD, have reviewed the above documentation for accuracy and completeness, and I agree with the above.

## 2020-06-08 ENCOUNTER — Inpatient Hospital Stay (HOSPITAL_COMMUNITY): Payer: BC Managed Care – PPO

## 2020-06-13 ENCOUNTER — Inpatient Hospital Stay (HOSPITAL_COMMUNITY): Payer: BC Managed Care – PPO | Attending: Hematology

## 2020-06-13 ENCOUNTER — Other Ambulatory Visit: Payer: Self-pay

## 2020-06-13 DIAGNOSIS — Z809 Family history of malignant neoplasm, unspecified: Secondary | ICD-10-CM | POA: Insufficient documentation

## 2020-06-13 DIAGNOSIS — Z79899 Other long term (current) drug therapy: Secondary | ICD-10-CM | POA: Insufficient documentation

## 2020-06-13 DIAGNOSIS — I82402 Acute embolism and thrombosis of unspecified deep veins of left lower extremity: Secondary | ICD-10-CM | POA: Diagnosis not present

## 2020-06-13 DIAGNOSIS — Z7901 Long term (current) use of anticoagulants: Secondary | ICD-10-CM | POA: Diagnosis not present

## 2020-06-13 DIAGNOSIS — Z808 Family history of malignant neoplasm of other organs or systems: Secondary | ICD-10-CM | POA: Insufficient documentation

## 2020-06-13 DIAGNOSIS — Z832 Family history of diseases of the blood and blood-forming organs and certain disorders involving the immune mechanism: Secondary | ICD-10-CM | POA: Diagnosis not present

## 2020-06-13 DIAGNOSIS — Z86718 Personal history of other venous thrombosis and embolism: Secondary | ICD-10-CM | POA: Insufficient documentation

## 2020-06-13 DIAGNOSIS — Z8042 Family history of malignant neoplasm of prostate: Secondary | ICD-10-CM | POA: Insufficient documentation

## 2020-06-13 DIAGNOSIS — I2699 Other pulmonary embolism without acute cor pulmonale: Secondary | ICD-10-CM | POA: Diagnosis present

## 2020-06-13 DIAGNOSIS — M79606 Pain in leg, unspecified: Secondary | ICD-10-CM | POA: Diagnosis not present

## 2020-06-13 DIAGNOSIS — D6862 Lupus anticoagulant syndrome: Secondary | ICD-10-CM | POA: Insufficient documentation

## 2020-06-13 DIAGNOSIS — Z803 Family history of malignant neoplasm of breast: Secondary | ICD-10-CM | POA: Insufficient documentation

## 2020-06-13 DIAGNOSIS — I82422 Acute embolism and thrombosis of left iliac vein: Secondary | ICD-10-CM

## 2020-06-13 DIAGNOSIS — R0602 Shortness of breath: Secondary | ICD-10-CM | POA: Insufficient documentation

## 2020-06-13 LAB — COMPREHENSIVE METABOLIC PANEL
ALT: 20 U/L (ref 0–44)
AST: 22 U/L (ref 15–41)
Albumin: 4.2 g/dL (ref 3.5–5.0)
Alkaline Phosphatase: 94 U/L (ref 38–126)
Anion gap: 4 — ABNORMAL LOW (ref 5–15)
BUN: 6 mg/dL (ref 6–20)
CO2: 31 mmol/L (ref 22–32)
Calcium: 8.8 mg/dL — ABNORMAL LOW (ref 8.9–10.3)
Chloride: 102 mmol/L (ref 98–111)
Creatinine, Ser: 1.2 mg/dL (ref 0.61–1.24)
GFR, Estimated: >60 mL/min (ref >60)
Glucose, Bld: 87 mg/dL (ref 70–99)
Potassium: 3.5 mmol/L (ref 3.5–5.1)
Sodium: 137 mmol/L (ref 135–145)
Total Bilirubin: 0.2 mg/dL — ABNORMAL LOW (ref 0.3–1.2)
Total Protein: 7.5 g/dL (ref 6.5–8.1)

## 2020-06-13 LAB — CBC WITH DIFFERENTIAL/PLATELET
Abs Immature Granulocytes: 0.02 10*3/uL (ref 0.00–0.07)
Basophils Absolute: 0 10*3/uL (ref 0.0–0.1)
Basophils Relative: 1 %
Eosinophils Absolute: 0.3 10*3/uL (ref 0.0–0.5)
Eosinophils Relative: 4 %
HCT: 42.2 % (ref 39.0–52.0)
Hemoglobin: 14.3 g/dL (ref 13.0–17.0)
Immature Granulocytes: 0 %
Lymphocytes Relative: 54 %
Lymphs Abs: 3.3 10*3/uL (ref 0.7–4.0)
MCH: 34.5 pg — ABNORMAL HIGH (ref 26.0–34.0)
MCHC: 33.9 g/dL (ref 30.0–36.0)
MCV: 101.9 fL — ABNORMAL HIGH (ref 80.0–100.0)
Monocytes Absolute: 0.4 10*3/uL (ref 0.1–1.0)
Monocytes Relative: 6 %
Neutro Abs: 2.1 10*3/uL (ref 1.7–7.7)
Neutrophils Relative %: 35 %
Platelets: 232 10*3/uL (ref 150–400)
RBC: 4.14 MIL/uL — ABNORMAL LOW (ref 4.22–5.81)
RDW: 13.2 % (ref 11.5–15.5)
WBC: 6 10*3/uL (ref 4.0–10.5)
nRBC: 0 % (ref 0.0–0.2)

## 2020-06-13 LAB — D-DIMER, QUANTITATIVE: D-Dimer, Quant: <0.27 ug/mL-FEU (ref 0.00–0.50)

## 2020-06-14 NOTE — Progress Notes (Signed)
[facility] [address], [place] [address]   CLINIC:  Medical Oncology/Hematology  PCP:  Patient, No Pcp Per (Inactive) None  None  REASON FOR VISIT:  Follow-up for DVT of left leg and bilateral PE  PRIOR THERAPY: Xarelto  CURRENT THERAPY: Xarelto 20 mg QD  INTERVAL HISTORY:  Steven Knox, a 40 y.o. male, returns for routine follow-up for his DVT of left leg and bilateral PE. Steven Knox was last seen on 12/02/2019.  Today he reports feeling good. He has been taking Xarelto and tolerating it well. He denies any SOB, and his energy levels are good. Before reporting to the ED for his initial left leg DVT he was confined to the bed because he believed he had a leg injury due to the pain in his leg. Prior to the DVT denies any recent travel or surgery. He denies any fevers or unexpected weight loss. He reports constant sweating, but this is stable and at baseline.   REVIEW OF SYSTEMS:  Review of Systems  Constitutional:  Negative for appetite change and fatigue.  Respiratory:  Positive for shortness of breath.   All other systems reviewed and are negative.  PAST MEDICAL/SURGICAL HISTORY:  Past Medical History:  Diagnosis Date   DVT (deep venous thrombosis) (HCC)    Left internal iliac vein extending to the junction of the left common iliac vein - July 2019   Pulmonary embolus [facility])    July 2019   Past Surgical History:  Procedure Laterality Date   NO PAST SURGERIES      SOCIAL HISTORY:  Social History   Socioeconomic History   Marital status: Single    Spouse name: Not on file   Number of children: Not on file   Years of education: Not on file   Highest education level: Not on file  Occupational History   Not on file  Tobacco Use   Smoking status: Never   Smokeless tobacco: Never  Substance and Sexual Activity   Alcohol use: Not Currently    Comment: once a month   Drug use: No   Sexual activity: Yes  Other Topics Concern   Not on file   Social History Narrative   Not on file   Social Determinants of Health   Financial Resource Strain: Low Risk    Difficulty of Paying Living Expenses: Not hard at all  Food Insecurity: No Food Insecurity   Worried About Programme researcher, broadcasting/film/video in the Last Year: Never true   Ran Out of Food in the Last Year: Never true  Transportation Needs: No Transportation Needs   Lack of Transportation (Medical): No   Lack of Transportation (Non-Medical): No  Physical Activity: Sufficiently Active   Days of Exercise per Week: 3 days   Minutes of Exercise per Session: 60 min  Stress: No Stress Concern Present   Feeling of Stress : Not at all  Social Connections: Moderately Isolated   Frequency of Communication with Friends and Family: Twice a week   Frequency of Social Gatherings with Friends and Family: Once a week   Attends Religious Services: Never   Database administrator or Organizations: Yes   Attends Engineer, structural: 1 to 4 times per year   Marital Status: Never married  Catering manager Violence: Not At Risk   Fear of Current or Ex-Partner: No   Emotionally Abused: No   Physically Abused: No   Sexually Abused: No    FAMILY HISTORY:  Family History  Problem Relation Age of Onset   Lupus Sister    Deep vein thrombosis Sister    Cancer Father        Lymph nodes   Breast cancer Mother    Breast cancer Maternal Grandmother    Cancer Maternal Grandfather    Prostate cancer Paternal Grandfather     CURRENT MEDICATIONS:  Current Outpatient Medications  Medication Sig Dispense Refill   rivaroxaban (XARELTO) 20 MG TABS tablet Take 1 tablet (20 mg total) by mouth daily with supper. 90 tablet 3   No current facility-administered medications for this visit.    ALLERGIES:  No Known Allergies  PHYSICAL EXAM:  Performance status (ECOG): 1 - Symptomatic but completely ambulatory  There were no vitals filed for this visit. Wt Readings from Last 3 Encounters:  12/02/19  202 lb 9.6 oz (91.9 kg)  07/29/17 196 lb 1.6 oz (89 kg)  07/17/17 210 lb (95.3 kg)   Physical Exam Vitals reviewed.  Constitutional:      Appearance: Normal appearance.  Cardiovascular:     Rate and Rhythm: Normal rate and regular rhythm.     Pulses: Normal pulses.     Heart sounds: Normal heart sounds.  Pulmonary:     Effort: Pulmonary effort is normal.     Breath sounds: Normal breath sounds.  Abdominal:     Palpations: Abdomen is soft. There is no hepatomegaly, splenomegaly or mass.     Tenderness: There is abdominal tenderness.  Lymphadenopathy:     Lower Body: No right inguinal adenopathy. No left inguinal adenopathy.  Neurological:     General: No focal deficit present.     Mental Status: He is alert and oriented to person, place, and time.  Psychiatric:        Mood and Affect: Mood normal.        Behavior: Behavior normal.    LABORATORY DATA:  I have reviewed the labs as listed.  CBC Latest Ref Rng & Units 06/13/2020 11/24/2019 07/19/2017  WBC 4.0 - 10.5 K/uL 6.0 5.4 10.3  Hemoglobin 13.0 - 17.0 g/dL 09.3 23.5 57.3  Hematocrit 39.0 - 52.0 % 42.2 39.8 39.0  Platelets 150 - 400 K/uL 232 234 204   CMP Latest Ref Rng & Units 06/13/2020 11/24/2019 07/19/2017  Glucose 70 - 99 mg/dL 87 220(U) 542(H)  BUN 6 - 20 mg/dL 6 <0(W) 10  Creatinine 0.61 - 1.24 mg/dL 2.37 6.28 3.15  Sodium 135 - 145 mmol/L 137 140 139  Potassium 3.5 - 5.1 mmol/L 3.5 4.1 3.8  Chloride 98 - 111 mmol/L 102 105 103  CO2 22 - 32 mmol/L 31 28 29   Calcium 8.9 - 10.3 mg/dL ) 9.5 9.1  Total Protein 6.5 - 8.1 g/dL 7.5 7.9 -  Total Bilirubin 0.3 - 1.2 mg/dL 1.7(O) 0.4 -  Alkaline Phos 38 - 126 U/L 94 96 -  AST 15 - 41 U/L 22 21 -  ALT 0 - 44 U/L 20 23 -      Component Value Date/Time   RBC 4.14 (L) 06/13/2020 1559   MCV 101.9 (H) 06/13/2020 1559   MCH 34.5 (H) 06/13/2020 1559   MCHC 33.9 06/13/2020 1559   RDW 13.2 06/13/2020 1559   LYMPHSABS 3.3 06/13/2020 1559   MONOABS 0.4 06/13/2020 1559    EOSABS 0.3 06/13/2020 1559   BASOSABS 0.0 06/13/2020 1559    DIAGNOSTIC IMAGING:  I have independently reviewed the scans and discussed with the patient. No results found.  ASSESSMENT:  1.  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 -A CT scan of the chest PE protocol on 07/18/2017 consistent with bilateral PE and bilateral lower lobe infarcts. -He was started on IV heparin which was transitioned to Xarelto.  He is tolerating it very well. - He gives history of left lower extremity sciatica and was completely bedridden for 3 days, 4 weeks prior to presentation to the ER.  Hence it can be considered as a weakly provoked DVT. - Lupus anticoagulant, anti-beta-2 glycoprotein 1 and anticardiolipin antibodies were negative.  Protein C, protein S, Antithrombin III, factor V Leiden and prothrombin gene mutation testing was also negative.   PLAN:  1.  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 - Hence he was recommended indefinite anticoagulation. - He was started back on Xarelto in November 2021.  He did not experience any bleeding issues.  He does not report any difficulty obtaining Xarelto. Data reviewed labs from 06/13/2020.  D-dimer has become undetectable again.  CBC was grossly normal.  Physical examination was uneventful. - Recommend follow-up in 1 year with repeat D-dimer.  Orders placed this encounter:  No orders of the defined types were placed in this encounter.    Doreatha Massed, MD [facility] [phone]   I, Alda Ponder, am acting as a scribe for Dr. Doreatha Massed.  I, Doreatha Massed MD,  have reviewed the above documentation for accuracy and completeness, and I agree with the above.

## 2020-06-15 ENCOUNTER — Other Ambulatory Visit: Payer: Self-pay

## 2020-06-15 ENCOUNTER — Inpatient Hospital Stay (HOSPITAL_BASED_OUTPATIENT_CLINIC_OR_DEPARTMENT_OTHER): Payer: BC Managed Care – PPO | Admitting: Hematology

## 2020-06-15 VITALS — BP 146/86 | HR 64 | Temp 97.0°F | Resp 18 | Wt 205.7 lb

## 2020-06-15 DIAGNOSIS — I2699 Other pulmonary embolism without acute cor pulmonale: Secondary | ICD-10-CM

## 2020-06-15 DIAGNOSIS — I82422 Acute embolism and thrombosis of left iliac vein: Secondary | ICD-10-CM | POA: Diagnosis not present

## 2020-06-15 NOTE — Patient Instructions (Addendum)
[facility] at [facility] Discharge Instructions  You were seen today by Dr. Katragadda. He went over your recent results. Dr. Katragadda will see you back in 1 year for labs and follow up.   Thank you for choosing [facility] at [facility] to provide your oncology and hematology care.  To afford each patient quality time with our provider, please arrive at least 15 minutes before your scheduled appointment time.   If you have a lab appointment with the Cancer Center please come in thru the Main Entrance and check in at the main information desk  You need to re-schedule your appointment should you arrive 10 or more minutes late.  We strive to give you quality time with our providers, and arriving late affects you and other patients whose appointments are after yours.  Also, if you no show three or more times for appointments you may be dismissed from the clinic at the providers discretion.     Again, thank you for choosing [facility].  Our hope is that these requests will decrease the amount of time that you wait before being seen by our physicians.       _____________________________________________________________  Should you have questions after your visit to [facility], please contact our office at [phone] between the hours of 8:00 a.m. and 4:30 p.m.  Voicemails left after 4:00 p.m. will not be returned until the following business day.  For prescription refill requests, have your pharmacy contact our office and allow 72 hours.    Cancer Center Support Programs:   > Cancer Support Group  2nd Tuesday of the month 1pm-2pm, Journey Room    

## 2020-12-09 ENCOUNTER — Inpatient Hospital Stay (HOSPITAL_COMMUNITY): Payer: BC Managed Care – PPO | Attending: Hematology

## 2020-12-16 ENCOUNTER — Ambulatory Visit (HOSPITAL_COMMUNITY): Payer: BC Managed Care – PPO | Admitting: Physician Assistant

## 2021-01-25 ENCOUNTER — Other Ambulatory Visit (HOSPITAL_COMMUNITY): Payer: Self-pay | Admitting: Hematology

## 2021-01-25 DIAGNOSIS — I82422 Acute embolism and thrombosis of left iliac vein: Secondary | ICD-10-CM
